# Patient Record
Sex: Female | Born: 1970 | Race: White | Hispanic: No | Marital: Married | Smoking: Never smoker
Health system: Southern US, Community
[De-identification: ages and names within clinical notes are randomized; demographics above are authoritative.]

## PROBLEM LIST (undated history)

## (undated) DIAGNOSIS — G43909 Migraine, unspecified, not intractable, without status migrainosus: Secondary | ICD-10-CM

## (undated) DIAGNOSIS — N200 Calculus of kidney: Secondary | ICD-10-CM

## (undated) DIAGNOSIS — E785 Hyperlipidemia, unspecified: Secondary | ICD-10-CM

## (undated) DIAGNOSIS — K219 Gastro-esophageal reflux disease without esophagitis: Secondary | ICD-10-CM

## (undated) HISTORY — PX: ABDOMINAL HYSTERECTOMY: SHX81

## (undated) HISTORY — DX: Migraine, unspecified, not intractable, without status migrainosus: G43.909

## (undated) HISTORY — DX: Gastro-esophageal reflux disease without esophagitis: K21.9

## (undated) HISTORY — DX: Hyperlipidemia, unspecified: E78.5

## (undated) HISTORY — PX: CHOLECYSTECTOMY: SHX55

## (undated) HISTORY — PX: TONSILLECTOMY: SUR1361

## (undated) HISTORY — PX: APPENDECTOMY: SHX54

---

## 2001-05-28 DIAGNOSIS — N200 Calculus of kidney: Secondary | ICD-10-CM

## 2001-05-28 HISTORY — DX: Calculus of kidney: N20.0

## 2009-03-20 ENCOUNTER — Ambulatory Visit: Payer: Self-pay | Admitting: Diagnostic Radiology

## 2009-03-20 ENCOUNTER — Emergency Department (HOSPITAL_BASED_OUTPATIENT_CLINIC_OR_DEPARTMENT_OTHER): Admission: EM | Admit: 2009-03-20 | Discharge: 2009-03-20 | Payer: Self-pay | Admitting: Emergency Medicine

## 2010-06-29 ENCOUNTER — Observation Stay (HOSPITAL_COMMUNITY)
Admission: EM | Admit: 2010-06-29 | Discharge: 2010-06-30 | Disposition: A | Discharge status: Home / Self Care | Payer: 59 | Attending: Emergency Medicine | Admitting: Emergency Medicine

## 2010-06-29 ENCOUNTER — Emergency Department (INDEPENDENT_AMBULATORY_CARE_PROVIDER_SITE_OTHER): Payer: 59

## 2010-06-29 ENCOUNTER — Emergency Department (HOSPITAL_BASED_OUTPATIENT_CLINIC_OR_DEPARTMENT_OTHER)
Admission: EM | Admit: 2010-06-29 | Discharge: 2010-06-29 | Disposition: A | Discharge status: Nursing Home (Includes ICF) | Payer: 59 | Source: Home / Self Care | Attending: Emergency Medicine | Admitting: Emergency Medicine

## 2010-06-29 DIAGNOSIS — F3289 Other specified depressive episodes: Secondary | ICD-10-CM | POA: Insufficient documentation

## 2010-06-29 DIAGNOSIS — R209 Unspecified disturbances of skin sensation: Secondary | ICD-10-CM | POA: Insufficient documentation

## 2010-06-29 DIAGNOSIS — R51 Headache: Secondary | ICD-10-CM | POA: Insufficient documentation

## 2010-06-29 DIAGNOSIS — F329 Major depressive disorder, single episode, unspecified: Secondary | ICD-10-CM | POA: Insufficient documentation

## 2010-06-29 LAB — BASIC METABOLIC PANEL
BUN: 12 mg/dL (ref 6–23)
CO2: 26 mEq/L (ref 19–32)
Calcium: 9.7 mg/dL (ref 8.4–10.5)
GFR calc Af Amer: >60 mL/min (ref >60)
GFR calc non Af Amer: >60 mL/min (ref >60)
Glucose, Bld: 93 mg/dL (ref 70–99)

## 2010-06-29 LAB — DIFFERENTIAL
Eosinophils Absolute: 0.3 10*3/uL (ref 0.0–0.7)
Lymphocytes Relative: 33 % (ref 12–46)
Lymphs Abs: 3.8 10*3/uL (ref 0.7–4.0)
Monocytes Absolute: 1 10*3/uL (ref 0.1–1.0)
Monocytes Relative: 9 % (ref 3–12)
Neutro Abs: 6.5 10*3/uL (ref 1.7–7.7)
Neutrophils Relative %: 56 % (ref 43–77)

## 2010-06-29 LAB — CBC
HCT: 38.7 % (ref 36.0–46.0)
MCHC: 34.4 g/dL (ref 30.0–36.0)
MCV: 83.9 fL (ref 78.0–100.0)
Platelets: 426 10*3/uL — ABNORMAL HIGH (ref 150–400)
RBC: 4.61 MIL/uL (ref 3.87–5.11)
WBC: 11.6 10*3/uL — ABNORMAL HIGH (ref 4.0–10.5)

## 2010-06-30 ENCOUNTER — Observation Stay (HOSPITAL_COMMUNITY): Payer: 59

## 2010-06-30 DIAGNOSIS — G459 Transient cerebral ischemic attack, unspecified: Secondary | ICD-10-CM

## 2010-06-30 LAB — LIPID PANEL
Cholesterol: 184 mg/dL (ref 0–200)
LDL Cholesterol: 127 mg/dL — ABNORMAL HIGH (ref 0–99)
Total CHOL/HDL Ratio: 5.3 RATIO
Triglycerides: 109 mg/dL (ref <150)
VLDL: 22 mg/dL (ref 0–40)

## 2010-06-30 LAB — URINALYSIS, ROUTINE W REFLEX MICROSCOPIC
Bilirubin Urine: NEGATIVE
Hgb urine dipstick: NEGATIVE
Ketones, ur: NEGATIVE mg/dL
Nitrite: NEGATIVE
Protein, ur: NEGATIVE mg/dL
Urobilinogen, UA: 0.2 mg/dL (ref 0.0–1.0)

## 2010-06-30 LAB — COMPREHENSIVE METABOLIC PANEL
CO2: 23 mEq/L (ref 19–32)
Chloride: 105 mEq/L (ref 96–112)
GFR calc Af Amer: >60 mL/min (ref >60)
Potassium: 3.4 mEq/L — ABNORMAL LOW (ref 3.5–5.1)
Total Bilirubin: 0.3 mg/dL (ref 0.3–1.2)
Total Protein: 6.9 g/dL (ref 6.0–8.3)

## 2010-06-30 LAB — CK TOTAL AND CKMB (NOT AT ARMC)
CK, MB: 0.7 ng/mL (ref 0.3–4.0)
Total CK: 55 U/L (ref 7–177)
Total CK: 64 U/L (ref 7–177)

## 2010-06-30 LAB — PROTIME-INR: Prothrombin Time: 13.8 seconds (ref 11.6–15.2)

## 2010-06-30 LAB — HEMOGLOBIN A1C: Mean Plasma Glucose: 111 mg/dL (ref <117)

## 2010-06-30 LAB — TROPONIN I: Troponin I: 0.02 ng/mL (ref 0.00–0.06)

## 2013-02-21 ENCOUNTER — Emergency Department (HOSPITAL_BASED_OUTPATIENT_CLINIC_OR_DEPARTMENT_OTHER): Payer: 59

## 2013-02-21 ENCOUNTER — Emergency Department (HOSPITAL_BASED_OUTPATIENT_CLINIC_OR_DEPARTMENT_OTHER)
Admission: EM | Admit: 2013-02-21 | Discharge: 2013-02-21 | Disposition: A | Discharge status: Home / Self Care | Payer: 59 | Attending: Emergency Medicine | Admitting: Emergency Medicine

## 2013-02-21 ENCOUNTER — Encounter (HOSPITAL_BASED_OUTPATIENT_CLINIC_OR_DEPARTMENT_OTHER): Payer: Self-pay | Admitting: Emergency Medicine

## 2013-02-21 DIAGNOSIS — R0602 Shortness of breath: Secondary | ICD-10-CM | POA: Insufficient documentation

## 2013-02-21 DIAGNOSIS — IMO0001 Reserved for inherently not codable concepts without codable children: Secondary | ICD-10-CM | POA: Insufficient documentation

## 2013-02-21 DIAGNOSIS — J90 Pleural effusion, not elsewhere classified: Secondary | ICD-10-CM | POA: Insufficient documentation

## 2013-02-21 DIAGNOSIS — R059 Cough, unspecified: Secondary | ICD-10-CM | POA: Insufficient documentation

## 2013-02-21 DIAGNOSIS — R091 Pleurisy: Secondary | ICD-10-CM

## 2013-02-21 DIAGNOSIS — R05 Cough: Secondary | ICD-10-CM | POA: Insufficient documentation

## 2013-02-21 LAB — CBC WITH DIFFERENTIAL/PLATELET
Basophils Absolute: 0 10*3/uL (ref 0.0–0.1)
Basophils Relative: 0 % (ref 0–1)
Eosinophils Absolute: 0.3 10*3/uL (ref 0.0–0.7)
Hemoglobin: 13.3 g/dL (ref 12.0–15.0)
Lymphocytes Relative: 30 % (ref 12–46)
MCH: 28.7 pg (ref 26.0–34.0)
MCHC: 32.9 g/dL (ref 30.0–36.0)
MCV: 87.3 fL (ref 78.0–100.0)
Monocytes Relative: 8 % (ref 3–12)
Neutrophils Relative %: 60 % (ref 43–77)

## 2013-02-21 LAB — BASIC METABOLIC PANEL
BUN: 12 mg/dL (ref 6–23)
Chloride: 102 mEq/L (ref 96–112)
Creatinine, Ser: 0.9 mg/dL (ref 0.50–1.10)
GFR calc Af Amer: >90 mL/min (ref >90)
GFR calc non Af Amer: 78 mL/min — ABNORMAL LOW (ref >90)
Potassium: 4 mEq/L (ref 3.5–5.1)

## 2013-02-21 MED ORDER — IOHEXOL 350 MG/ML SOLN
80.0000 mL | Freq: Once | INTRAVENOUS | Status: AC | PRN
  Administered 2013-02-21: 80 mL via INTRAVENOUS

## 2013-02-21 MED ORDER — IBUPROFEN 600 MG PO TABS: 600.0000 mg | ORAL_TABLET | Freq: Four times a day (QID) | ORAL | Status: DC | PRN

## 2013-02-21 NOTE — ED Notes (Signed)
Pt reports left chest pain  That radiates to back and Left shoulder onset about 1 pm today denies hx cardiac pain increases with movement left arm

## 2013-02-22 NOTE — ED Provider Notes (Signed)
CSN: [id number]     Arrival date & time 02/21/13  1901 History   First MD Initiated Contact with Patient 02/21/13 1929     Chief Complaint  Patient presents with  . Chest Pain    shoulder pain with SOB   (Consider location/radiation/quality/duration/timing/severity/associated sxs/prior Treatment) HPI Comments: Patient presents with left lateral anterior chest pain that radiates to her left back and left shoulder that began at approximately 1 PM today. She denies recent injury. Pain is worse with deep breathing, coughing, and movement of her left arm. It is not made worse with palpation. She's taking over-the-counter medications without relief. She feels like she is having trouble catching her breath. She denies fever or productive cough. No nausea or vomiting, abdominal pain. No urinary symptoms. Patient does not have a history of blood clots, estrogen use, smoking, recent surgery, or lower extremity swelling. Patient was on a recent airplane ride from [facility] to [place]. No other treatments prior to arrival. No risk factors for coronary artery disease. Onset of symptoms acute. Nothing makes symptoms better.  Patient is a 42 y.o. female presenting with chest pain. The history is provided by the patient.  Chest Pain Associated symptoms: cough and shortness of breath   Associated symptoms: no abdominal pain, no back pain, no diaphoresis, no fever, no nausea, no palpitations and not vomiting     History reviewed. No pertinent past medical history. Past Surgical History  Procedure Laterality Date  . Cholecystectomy    . Appendectomy    . Cesarean section    . Tonsillectomy    . Abdominal hysterectomy     History reviewed. No pertinent family history. History  Substance Use Topics  . Smoking status: Never Smoker   . Smokeless tobacco: Not on file  . Alcohol Use: Not on file   OB History   Grav Para Term Preterm Abortions TAB SAB Ect Mult Living                 Review of Systems   Constitutional: Negative for fever and diaphoresis.  HENT: Negative for neck pain.   Eyes: Negative for redness.  Respiratory: Positive for cough and shortness of breath.   Cardiovascular: Positive for chest pain. Negative for palpitations and leg swelling.  Gastrointestinal: Negative for nausea, vomiting and abdominal pain.  Genitourinary: Negative for dysuria.  Musculoskeletal: Positive for myalgias. Negative for back pain.  Skin: Negative for rash.  Neurological: Negative for syncope and light-headedness.    Allergies  Review of patient's allergies indicates no known allergies.  Home Medications   Current Outpatient Rx  Name  Route  Sig  Dispense  Refill  . ibuprofen (ADVIL,MOTRIN) 600 MG tablet   Oral   Take 1 tablet (600 mg total) by mouth every 6 (six) hours as needed for pain.   20 tablet   0    BP 113/77  Pulse 72  Temp(Src) 97.6 F (36.4 C) (Oral)  Resp 16  Ht 5\' 3"  (1.6 m)  Wt 195 lb (88.451 kg)  BMI 34.55 kg/m2  SpO2 100% Physical Exam  Nursing note and vitals reviewed. Constitutional: She appears well-developed and well-nourished.  HENT:  Head: Normocephalic and atraumatic.  Mouth/Throat: Mucous membranes are normal. Mucous membranes are not dry.  Eyes: Conjunctivae are normal.  Neck: Trachea normal and normal range of motion. Neck supple. Normal carotid pulses and no JVD present. No muscular tenderness present. Carotid bruit is not present. No tracheal deviation present.  Cardiovascular: Normal rate, regular rhythm,  S1 normal, S2 normal, normal heart sounds and intact distal pulses.  Exam reveals no decreased pulses.   No murmur heard. Pulmonary/Chest: Effort normal. No respiratory distress. She has no wheezes. She exhibits no tenderness.  Chest pain is not reproducible on my exam.  Abdominal: Soft. Normal aorta and bowel sounds are normal. There is no tenderness. There is no rebound and no guarding.  Musculoskeletal: Normal range of motion.   Neurological: She is alert.  Skin: Skin is warm and dry. She is not diaphoretic. No cyanosis. No pallor.  Psychiatric: She has a normal mood and affect.    ED Course  Procedures (including critical care time) Labs Review Labs Reviewed  D-DIMER, QUANTITATIVE - Abnormal; Notable for the following:    D-Dimer, Quant 0.82 (*)    All other components within normal limits  CBC WITH DIFFERENTIAL - Abnormal; Notable for the following:    WBC 13.6 (*)    Neutro Abs 8.2 (*)    Lymphs Abs 4.1 (*)    Monocytes Absolute 1.1 (*)    All other components within normal limits  BASIC METABOLIC PANEL - Abnormal; Notable for the following:    Glucose, Bld 102 (*)    GFR calc non Af Amer 78 (*)    All other components within normal limits   Imaging Review Dg Chest 2 View  02/21/2013   CLINICAL DATA:  Pleuritic left chest pain, unable to catch her breast  EXAM: CHEST  2 VIEW  COMPARISON:  None  FINDINGS: Normal heart size, mediastinal contours, and pulmonary vascularity.  Lungs clear.  Bones unremarkable.  No pneumothorax.  IMPRESSION: No acute abnormalities.   Electronically Signed   By: Ulyses Southward M.D.   On: 02/21/2013 20:57   Ct Angio Chest Pe W/cm &/or Wo Cm  02/21/2013   *RADIOLOGY REPORT*  Clinical Data: 42 year old female with chest pain and elevated D- dimer.  CT ANGIOGRAPHY CHEST  Technique:  Multidetector CT imaging of the chest using the standard protocol during bolus administration of intravenous contrast. Multiplanar reconstructed images including MIPs were obtained and reviewed to evaluate the vascular anatomy.  Contrast: 80mL OMNIPAQUE IOHEXOL 350 MG/ML SOLN  Comparison: 02/21/2013 chest radiograph  Findings: This is a technically satisfactory study.  There is no evidence of pulmonary emboli. The heart and great vessels are unremarkable.  There is no evidence of thoracic aortic aneurysm/dissection. No pleural or pericardial effusions are present. There are no enlarged lymph nodes.  There is  no evidence of airspace disease, consolidation, suspicious nodule, mass or endobronchial/endotracheal lesion.  No acute or suspicious bony abnormalities are identified. The visualized upper abdomen is within normal limits.  IMPRESSION: Unremarkable exam - no evidence of pulmonary emboli or thoracic aortic aneurysm/dissection.   Original Report Authenticated By: Harmon Pier, M.D.   Patient seen and examined. Work-up initiated. Discussed utility of d-dimer and need to followup with chest CT if positive. Patient agrees to proceed.   Vital signs reviewed and are as follows: Filed Vitals:   02/21/13 2242  BP: 113/77  Pulse:   Temp: 97.6 F (36.4 C)  Resp: 16    Date: 02/22/2013  Rate: 77  Rhythm: normal sinus rhythm  QRS Axis: normal  Intervals: normal  ST/T Wave abnormalities: normal  Conduction Disutrbances:none  Narrative Interpretation:   Old EKG Reviewed: unchanged from 06/29/10  D-dimer elevated. Patient informed. Will proceed with CT angiography of the chest.  CT reviewed and interpreted by myself. No acute findings of PE or other abnormality per  radiologist. Patient informed. Will treat with nonsteroidal anti-inflammatory medications. Patient to return with fever, worsening trouble breathing or shortness of breath, change in chest pain.   MDM   1. Pleurisy    Patient with chest pain, suspect pleurisy or musculoskeletal pain.  Doubt cardiac origin of pain given: atypical CP, pleuritic nature of pain reproducable with movement and deep inspiration, normal EKG, no exertional component, lack of accompanying sx including palps, sweating. Do not feel any additional testing is warranted given lack of risk factors for CAD. Patient ruled out for pulmonary embolism. No life-threatening or serious etiologies requiring hospitalization suggested by workup or suspected at this time. Patient stable for discharge home with conservative management.     Renne Crigler, PA-C 02/22/13 [phone]

## 2013-02-26 NOTE — ED Provider Notes (Signed)
Medical screening examination/treatment/procedure(s) were performed by non-physician practitioner and as supervising physician I was immediately available for consultation/collaboration.   Valerian Jewel, MD 02/26/13 1514 

## 2014-02-18 DIAGNOSIS — R5383 Other fatigue: Secondary | ICD-10-CM | POA: Insufficient documentation

## 2014-02-18 DIAGNOSIS — Z0000 Encounter for general adult medical examination without abnormal findings: Secondary | ICD-10-CM | POA: Insufficient documentation

## 2015-06-19 ENCOUNTER — Encounter (HOSPITAL_BASED_OUTPATIENT_CLINIC_OR_DEPARTMENT_OTHER): Payer: Self-pay | Admitting: *Deleted

## 2015-06-19 ENCOUNTER — Emergency Department (HOSPITAL_BASED_OUTPATIENT_CLINIC_OR_DEPARTMENT_OTHER)
Admission: EM | Admit: 2015-06-19 | Discharge: 2015-06-19 | Disposition: A | Discharge status: Home / Self Care | Payer: 59 | Attending: Emergency Medicine | Admitting: Emergency Medicine

## 2015-06-19 DIAGNOSIS — G43109 Migraine with aura, not intractable, without status migrainosus: Secondary | ICD-10-CM | POA: Insufficient documentation

## 2015-06-19 DIAGNOSIS — J0110 Acute frontal sinusitis, unspecified: Secondary | ICD-10-CM | POA: Diagnosis not present

## 2015-06-19 DIAGNOSIS — R200 Anesthesia of skin: Secondary | ICD-10-CM | POA: Insufficient documentation

## 2015-06-19 DIAGNOSIS — R51 Headache: Secondary | ICD-10-CM | POA: Diagnosis present

## 2015-06-19 MED ORDER — KETOROLAC TROMETHAMINE 30 MG/ML IJ SOLN
30.0000 mg | Freq: Once | INTRAMUSCULAR | Status: AC
  Administered 2015-06-19: 30 mg via INTRAVENOUS
  Filled 2015-06-19: qty 1

## 2015-06-19 MED ORDER — MOMETASONE FUROATE 50 MCG/ACT NA SUSP: 2.0000 | Freq: Every day | NASAL | Status: DC

## 2015-06-19 MED ORDER — LORATADINE 10 MG PO TABS
10.0000 mg | ORAL_TABLET | Freq: Once | ORAL | Status: AC
  Administered 2015-06-19: 10 mg via ORAL
  Filled 2015-06-19: qty 1

## 2015-06-19 MED ORDER — DIPHENHYDRAMINE HCL 50 MG/ML IJ SOLN
25.0000 mg | Freq: Once | INTRAMUSCULAR | Status: AC
  Administered 2015-06-19: 25 mg via INTRAVENOUS
  Filled 2015-06-19: qty 1

## 2015-06-19 MED ORDER — METOCLOPRAMIDE HCL 10 MG PO TABS: 10.0000 mg | ORAL_TABLET | Freq: Four times a day (QID) | ORAL | Status: DC | PRN

## 2015-06-19 MED ORDER — LORATADINE 10 MG PO TABS: 10.0000 mg | ORAL_TABLET | Freq: Every day | ORAL | Status: DC

## 2015-06-19 MED ORDER — OXYMETAZOLINE HCL 0.05 % NA SOLN: 2.0000 | Freq: Two times a day (BID) | NASAL | Status: DC

## 2015-06-19 MED ORDER — METHYLPREDNISOLONE SODIUM SUCC 125 MG IJ SOLR
125.0000 mg | Freq: Once | INTRAMUSCULAR | Status: AC
  Administered 2015-06-19: 125 mg via INTRAVENOUS
  Filled 2015-06-19: qty 2

## 2015-06-19 MED ORDER — METOCLOPRAMIDE HCL 5 MG/ML IJ SOLN
10.0000 mg | Freq: Once | INTRAMUSCULAR | Status: AC
  Administered 2015-06-19: 10 mg via INTRAVENOUS
  Filled 2015-06-19: qty 2

## 2015-06-19 MED ORDER — SODIUM CHLORIDE 0.9 % IV BOLUS (SEPSIS)
1000.0000 mL | Freq: Once | INTRAVENOUS | Status: AC
  Administered 2015-06-19: 1000 mL via INTRAVENOUS

## 2015-06-19 NOTE — ED Provider Notes (Signed)
CSN: [id number]     Arrival date & time 06/19/15  1832 History  By signing my name below, I, Budd Palmer, attest that this documentation has been prepared under the direction and in the presence of Loren Racer, MD. Electronically Signed: Budd Palmer, ED Scribe. 06/19/2015. 8:23 PM.     Chief Complaint  Patient presents with  . Headache   The history is provided by the patient. No language interpreter was used.   HPI Comments: Katrina Elliott is a 45 y.o. female who presents to the Emergency Department complaining of a constant, aching frontal HA behind the right eye and radiating into the temple onset 4 hours ago. Pt states she had numbness in the right arm, difficulty speaking, and "squiggly lines" in the right eye at onset, all of which have since resolved. She currently endorses nausea and photophobia, as well as a persistent cough. She has taken excedrin for this 3 hours ago without relief. She notes she has been stressed recently and has not eaten much today. However, she states she has been drinking plenty of fluids. She notes a PMHx of the same for which she was seen at [facility] with a suspected stroke, which later was diagnosed as a complex migraine. She notes she was sick with a cold 1-2 weeks ago, which has been improving. Pt denies sore throat, rhinorrhea, and fever.   History reviewed. No pertinent past medical history. Past Surgical History  Procedure Laterality Date  . Cholecystectomy    . Appendectomy    . Cesarean section    . Tonsillectomy    . Abdominal hysterectomy     No family history on file. Social History  Substance Use Topics  . Smoking status: Never Smoker   . Smokeless tobacco: Never Used  . Alcohol Use: No   OB History    No data available     Review of Systems  Constitutional: Positive for chills. Negative for fever.  HENT: Positive for postnasal drip and sinus pressure. Negative for rhinorrhea and sore throat.   Eyes: Positive for photophobia and  visual disturbance.  Respiratory: Positive for cough. Negative for shortness of breath.   Gastrointestinal: Positive for nausea. Negative for vomiting and abdominal pain.  Genitourinary: Negative for dysuria, frequency, flank pain, enuresis and difficulty urinating.  Musculoskeletal: Negative for myalgias, back pain, neck pain and neck stiffness.  Skin: Negative for rash and wound.  Neurological: Positive for numbness and headaches. Negative for dizziness, weakness and light-headedness.  All other systems reviewed and are negative.   Allergies  Review of patient's allergies indicates no known allergies.  Home Medications   Prior to Admission medications   Medication Sig Start Date End Date Taking? Authorizing Provider  ibuprofen (ADVIL,MOTRIN) 600 MG tablet Take 1 tablet (600 mg total) by mouth every 6 (six) hours as needed for pain. 02/21/13   Renne Crigler, PA-C  loratadine (CLARITIN) 10 MG tablet Take 1 tablet (10 mg total) by mouth daily. 06/19/15   Loren Racer, MD  metoCLOPramide (REGLAN) 10 MG tablet Take 1 tablet (10 mg total) by mouth every 6 (six) hours as needed for nausea or vomiting (headache). 06/19/15   Loren Racer, MD  mometasone (NASONEX) 50 MCG/ACT nasal spray Place 2 sprays into the nose daily. 06/19/15   Loren Racer, MD  oxymetazoline (AFRIN NASAL SPRAY) 0.05 % nasal spray Place 2 sprays into both nostrils 2 (two) times daily. Use no more than 3 days 06/19/15   Loren Racer, MD   BP 113/65 mmHg  Pulse 81  Temp(Src) 98.3 F (36.8 C) (Oral)  Resp 18  Ht  (1.6 m)  Wt 200 lb (90.719 kg)  BMI 35.44 kg/m2  SpO2 99% Physical Exam  Constitutional: She is oriented to person, place, and time. She appears well-developed and well-nourished. No distress.  HENT:  Head: Normocephalic and atraumatic.  Mouth/Throat: Oropharynx is clear and moist. No oropharyngeal exudate.  Patient has tenderness to percussion over her left frontal sinus. She does have nasal  mucosal swelling.  Eyes: EOM are normal. Pupils are equal, round, and reactive to light.  Neck: Normal range of motion. Neck supple.  No meningismus. No cervical lymphadenopathy.  Cardiovascular: Normal rate and regular rhythm.  Exam reveals no gallop and no friction rub.   No murmur heard. Pulmonary/Chest: Effort normal and breath sounds normal. No respiratory distress. She has no wheezes. She has no rales. She exhibits no tenderness.  Abdominal: Soft. Bowel sounds are normal. She exhibits no distension and no mass. There is no tenderness. There is no rebound and no guarding.  Musculoskeletal: Normal range of motion. She exhibits no edema or tenderness.  No midline thoracic or lumbar tenderness. No CVA tenderness.  Neurological: She is alert and oriented to person, place, and time.  Patient is alert and oriented x3 with clear, goal oriented speech. Patient has 5/5 motor in all extremities. Sensation is intact to light touch. Bilateral finger-to-nose is normal with no signs of dysmetria. Patient has a normal gait and walks without assistance.  Skin: Skin is warm and dry. No rash noted. No erythema.  Psychiatric: She has a normal mood and affect. Her behavior is normal.  Nursing note and vitals reviewed.   ED Course  Procedures  DIAGNOSTIC STUDIES: Oxygen Saturation is 99% on RA, normal by my interpretation.    COORDINATION OF CARE: 8:19 PM - Discussed probable sinusitis which triggered the migraine. Discussed plans to order medication for migraine and sinusitis. Pt advised of plan for treatment and pt agrees.  Labs Review Labs Reviewed - No data to display  Imaging Review No results found. I have personally reviewed and evaluated these images and lab results as part of my medical decision-making.   EKG Interpretation None      MDM   Final diagnoses:  Migraine with aura and without status migrainosus, not intractable  Acute frontal sinusitis, recurrence not specified    I  personally performed the services described in this documentation, which was scribed in my presence. The recorded information has been reviewed and is accurate.   Patient has history of, migraines. She presents with right hand numbness in association with photophobia, nausea and throbbing right-sided headache. Her numbness has resolved. She has a normal neurologic exam. Questionable left frontal sinusitis given postnasal drip, nasal congestion and sinus tenderness. We'll treat and reevaluate. Do not believe that imaging is necessary at this point.  Headache is resolved. Patient maintains a normal neurologic exam. Return precautions given.  Loren Racer, MD 06/19/15 2153

## 2015-06-19 NOTE — Discharge Instructions (Signed)
Migraine Headache A migraine headache is very bad, throbbing pain on one or both sides of your head. Talk to your doctor about what things may bring on (trigger) your migraine headaches. HOME CARE  Only take medicines as told by your doctor.  Lie down in a dark, quiet room when you have a migraine.  Keep a journal to find out if certain things bring on migraine headaches. For example, write down:  What you eat and drink.  How much sleep you get.  Any change to your diet or medicines.  Lessen how much alcohol you drink.  Quit smoking if you smoke.  Get enough sleep.  Lessen any stress in your life.  Keep lights dim if bright lights bother you or make your migraines worse. GET HELP RIGHT AWAY IF:   Your migraine becomes really bad.  You have a fever.  You have a stiff neck.  You have trouble seeing.  Your muscles are weak, or you lose muscle control.  You lose your balance or have trouble walking.  You feel like you will pass out (faint), or you pass out.  You have really bad symptoms that are different than your first symptoms. MAKE SURE YOU:   Understand these instructions.  Will watch your condition.  Will get help right away if you are not doing well or get worse.   This information is not intended to replace advice given to you by your health care provider. Make sure you discuss any questions you have with your health care provider.   Document Released: 02/21/2008 Document Revised: 08/06/2011 Document Reviewed: 01/19/2013 Elsevier Interactive Patient Education 2016 [employer]  Sinusitis, Adult Sinusitis is redness, soreness, and inflammation of the paranasal sinuses. Paranasal sinuses are air pockets within the bones of your face. They are located beneath your eyes, in the middle of your forehead, and above your eyes. In healthy paranasal sinuses, mucus is able to drain out, and air is able to circulate through them by way of your nose. However, when  your paranasal sinuses are inflamed, mucus and air can become trapped. This can allow bacteria and other germs to grow and cause infection. Sinusitis can develop quickly and last only a short time (acute) or continue over a long period (chronic). Sinusitis that lasts for more than 12 weeks is considered chronic. CAUSES Causes of sinusitis include:  Allergies.  Structural abnormalities, such as displacement of the cartilage that separates your nostrils (deviated septum), which can decrease the air flow through your nose and sinuses and affect sinus drainage.  Functional abnormalities, such as when the small hairs (cilia) that line your sinuses and help remove mucus do not work properly or are not present. SIGNS AND SYMPTOMS Symptoms of acute and chronic sinusitis are the same. The primary symptoms are pain and pressure around the affected sinuses. Other symptoms include:  Upper toothache.  Earache.  Headache.  Bad breath.  Decreased sense of smell and taste.  A cough, which worsens when you are lying flat.  Fatigue.  Fever.  Thick drainage from your nose, which often is green and may contain pus (purulent).  Swelling and warmth over the affected sinuses. DIAGNOSIS Your health care provider will perform a physical exam. During your exam, your health care provider may perform any of the following to help determine if you have acute sinusitis or chronic sinusitis:  Look in your nose for signs of abnormal growths in your nostrils (nasal polyps).  Tap over the affected sinus to check  for signs of infection.  View the inside of your sinuses using an imaging device that has a light attached (endoscope). If your health care provider suspects that you have chronic sinusitis, one or more of the following tests may be recommended:  Allergy tests.  Nasal culture. A sample of mucus is taken from your nose, sent to a lab, and screened for bacteria.  Nasal cytology. A sample of mucus is  taken from your nose and examined by your health care provider to determine if your sinusitis is related to an allergy. TREATMENT Most cases of acute sinusitis are related to a viral infection and will resolve on their own within 10 days. Sometimes, medicines are prescribed to help relieve symptoms of both acute and chronic sinusitis. These may include pain medicines, decongestants, nasal steroid sprays, or saline sprays. However, for sinusitis related to a bacterial infection, your health care provider will prescribe antibiotic medicines. These are medicines that will help kill the bacteria causing the infection. Rarely, sinusitis is caused by a fungal infection. In these cases, your health care provider will prescribe antifungal medicine. For some cases of chronic sinusitis, surgery is needed. Generally, these are cases in which sinusitis recurs more than 3 times per year, despite other treatments. HOME CARE INSTRUCTIONS  Drink plenty of water. Water helps thin the mucus so your sinuses can drain more easily.  Use a humidifier.  Inhale steam 3-4 times a day (for example, sit in the bathroom with the shower running).  Apply a warm, moist washcloth to your face 3-4 times a day, or as directed by your health care provider.  Use saline nasal sprays to help moisten and clean your sinuses.  Take medicines only as directed by your health care provider.  If you were prescribed either an antibiotic or antifungal medicine, finish it all even if you start to feel better. SEEK IMMEDIATE MEDICAL CARE IF:  You have increasing pain or severe headaches.  You have nausea, vomiting, or drowsiness.  You have swelling around your face.  You have vision problems.  You have a stiff neck.  You have difficulty breathing.   This information is not intended to replace advice given to you by your health care provider. Make sure you discuss any questions you have with your health care provider.   Document  Released: 05/14/2005 Document Revised: 06/04/2014 Document Reviewed: 05/29/2011 Elsevier Interactive Patient Education [employer]

## 2015-06-19 NOTE — ED Notes (Signed)
C/o HA, onset 1600, also nausea, aura, light sensitivity, and R hand numbness, rates 8/10. (denies: vomiting, dizziness, fever, rash or other sx)

## 2015-06-19 NOTE — ED Notes (Signed)
Pt reports headache onset 430pm; reports she had right hand numbness which has resolved. +nasuea. Has had migraines in the past

## 2015-06-19 NOTE — ED Notes (Signed)
Dr. Georgiann Mccoy at [facility]

## 2016-07-10 DIAGNOSIS — Z23 Encounter for immunization: Secondary | ICD-10-CM | POA: Insufficient documentation

## 2016-10-14 ENCOUNTER — Encounter (HOSPITAL_BASED_OUTPATIENT_CLINIC_OR_DEPARTMENT_OTHER): Payer: Self-pay | Admitting: *Deleted

## 2016-10-14 ENCOUNTER — Emergency Department (HOSPITAL_BASED_OUTPATIENT_CLINIC_OR_DEPARTMENT_OTHER): Payer: 59

## 2016-10-14 ENCOUNTER — Emergency Department (HOSPITAL_BASED_OUTPATIENT_CLINIC_OR_DEPARTMENT_OTHER)
Admission: EM | Admit: 2016-10-14 | Discharge: 2016-10-14 | Disposition: A | Discharge status: Home / Self Care | Payer: 59 | Attending: Emergency Medicine | Admitting: Emergency Medicine

## 2016-10-14 DIAGNOSIS — R109 Unspecified abdominal pain: Secondary | ICD-10-CM

## 2016-10-14 DIAGNOSIS — R1084 Generalized abdominal pain: Secondary | ICD-10-CM | POA: Insufficient documentation

## 2016-10-14 HISTORY — DX: Calculus of kidney: N20.0

## 2016-10-14 HISTORY — DX: Migraine, unspecified, not intractable, without status migrainosus: G43.909

## 2016-10-14 LAB — URINALYSIS, ROUTINE W REFLEX MICROSCOPIC
BILIRUBIN URINE: NEGATIVE
Glucose, UA: NEGATIVE mg/dL
Hgb urine dipstick: NEGATIVE
Ketones, ur: NEGATIVE mg/dL
LEUKOCYTES UA: NEGATIVE
NITRITE: NEGATIVE
PH: 5.5 (ref 5.0–8.0)
PROTEIN: NEGATIVE mg/dL
Specific Gravity, Urine: 1.021 (ref 1.005–1.030)

## 2016-10-14 MED ORDER — METHOCARBAMOL 500 MG PO TABS: 500.0000 mg | ORAL_TABLET | Freq: Two times a day (BID) | ORAL | 0 refills | Status: DC

## 2016-10-14 MED ORDER — KETOROLAC TROMETHAMINE 30 MG/ML IJ SOLN
30.0000 mg | Freq: Once | INTRAMUSCULAR | Status: AC
  Administered 2016-10-14: 30 mg via INTRAVENOUS
  Filled 2016-10-14: qty 1

## 2016-10-14 MED ORDER — IBUPROFEN 800 MG PO TABS: 800.0000 mg | ORAL_TABLET | Freq: Three times a day (TID) | ORAL | 0 refills | Status: AC | PRN

## 2016-10-14 NOTE — ED Triage Notes (Addendum)
Patient states that she developed left flank pain three days ago.  States the pain has progressively gotten worse.  History of kidney stones 15 years ago. Pain is associated with burning with urination.

## 2016-10-14 NOTE — ED Notes (Signed)
Patient transported to CT 

## 2016-10-14 NOTE — ED Provider Notes (Signed)
Emergency Department Provider Note   I have reviewed the triage vital signs and the nursing notes.   HISTORY  Chief Complaint Flank Pain (left)   HPI Katrina Elliott is a 46 y.o. female with past medical history of kidney stones presents to the emergency department for evaluation of left flank pain over the last 3 days. She has some associated dysuria but no hematuria. No fevers or chills. Her symptoms have been waxing and waning of the past several days but become more persistent today which prompted the emergency department visit. Chest pain or difficulty breathing. Pain does not radiate. No vaginal bleeding or discharge. No modifying factors.   Past Medical History:  Diagnosis Date  . Kidney stone 2003  . Migraine     There are no active problems to display for this patient.   Past Surgical History:  Procedure Laterality Date  . ABDOMINAL HYSTERECTOMY    . APPENDECTOMY    . CESAREAN SECTION    . CHOLECYSTECTOMY    . TONSILLECTOMY      Current Outpatient Rx  . Order #: [id number] Class: Print  . Order #: [id number] Class: Historical Med  . Order #: [id number] Class: Historical Med  . Order #: [id number] Class: Print  . Order #: [id number] Class: Print  . Order #: [id number] Class: Print  . Order #: [id number] Class: Print    Allergies Patient has no known allergies.  No family history on file.  Social History Social History  Substance Use Topics  . Smoking status: Never Smoker  . Smokeless tobacco: Never Used  . Alcohol use No    Review of Systems  Constitutional: No fever/chills Eyes: No visual changes. ENT: No sore throat. Cardiovascular: Denies chest pain. Respiratory: Denies shortness of breath. Gastrointestinal: No abdominal pain.  No nausea, no vomiting.  No diarrhea.  No constipation. Genitourinary: Positive for dysuria and left flank pain.  Musculoskeletal: Negative for back pain. Skin: Negative for rash. Neurological: Negative for headaches, focal weakness  or numbness.  10-point ROS otherwise negative.  ____________________________________________   PHYSICAL EXAM:  VITAL SIGNS: ED Triage Vitals  Enc Vitals Group     BP 10/14/16 1032 124/79     Pulse Rate 10/14/16 1032 84     Resp 10/14/16 1032 18     Temp 10/14/16 1032 98.3 F (36.8 C)     Temp Source 10/14/16 1032 Oral     SpO2 10/14/16 1032 100 %     Weight 10/14/16 1032 190 lb (86.2 kg)     Height 10/14/16 1032 5\' 3"  (1.6 m)     Pain Score 10/14/16 1029 8   Constitutional: Alert and oriented. Well appearing and in no acute distress. Eyes: Conjunctivae are normal. Head: Atraumatic. Nose: No congestion/rhinnorhea. Mouth/Throat: Mucous membranes are moist.  Oropharynx non-erythematous. Neck: No stridor.  Cardiovascular: Normal rate, regular rhythm. Good peripheral circulation. Grossly normal heart sounds.   Respiratory: Normal respiratory effort.  No retractions. Lungs CTAB. Gastrointestinal: Soft with mild LLQ tenderness. Positive left flank tenderness to percussion. No distention.  Musculoskeletal: No lower extremity tenderness nor edema. No gross deformities of extremities. Neurologic:  Normal speech and language. No gross focal neurologic deficits are appreciated.  Skin:  Skin is warm, dry and intact. No rash noted. Psychiatric: Mood and affect are normal. Speech and behavior are normal.  ____________________________________________   LABS (all labs ordered are listed, but only abnormal results are displayed)  Labs Reviewed  URINALYSIS, ROUTINE W REFLEX MICROSCOPIC   ____________________________________________  RADIOLOGY  Ct Renal Stone Study  Result Date: 10/14/2016 CLINICAL DATA:  Left flank pain, pain with urination, unsure if hematuria, hx renal stones 15+ yrs ago Hx: GB, Hysterectomy, APPY EXAM: CT ABDOMEN AND PELVIS WITHOUT CONTRAST TECHNIQUE: Multidetector CT imaging of the abdomen and pelvis was performed following the standard protocol without IV  contrast. COMPARISON:  None. FINDINGS: Lower chest: 4 mm nodule, subpleural, lateral right lower lobe, image 6, series 21. Lung bases otherwise clear. Heart normal size. Hepatobiliary: No focal liver abnormality is seen. Status post cholecystectomy. No biliary dilatation. Pancreas: Unremarkable. No pancreatic ductal dilatation or surrounding inflammatory changes. Spleen: Normal in size without focal abnormality. Adrenals/Urinary Tract: No adrenal masses. Kidneys are normal size, orientation and position. No renal masses or stones. No hydronephrosis. Normal ureters. Normal bladder. Stomach/Bowel: Stomach, small bowel and colon are unremarkable Vascular/Lymphatic: No significant vascular findings are present. No enlarged abdominal or pelvic lymph nodes. Reproductive: Status post hysterectomy. No adnexal masses. Other: No abdominal wall hernia or abnormality. No abdominopelvic ascites. Musculoskeletal: No acute or significant osseous findings. IMPRESSION: 1. No acute findings within the abdomen pelvis. No renal or ureteral stones or obstructive uropathy. No findings to account for left flank pain. 2. Status post cholecystectomy.  Status post hysterectomy. Electronically Signed   By: Amie Portland M.D.   On: 10/14/2016 11:24    ____________________________________________   PROCEDURES  Procedure(s) performed:   Procedures  None ____________________________________________   INITIAL IMPRESSION / ASSESSMENT AND PLAN / ED COURSE  Pertinent labs & imaging results that were available during my care of the patient were reviewed by me and considered in my medical decision making (see chart for details).  Patient resents emergency department for evaluation of left flank pain over the past 3 days associated dysuria. Urinalysis is negative for infection or blood. CT renal protocol negative for stones. No secondary evidence of obstructing stone from radiopaque source. Plan for MSK pain medication and close  primary care physician follow-up. Low suspicion for vascular etiology of pain in this case. Patient is having improved pain control after Toradol.  Normal UA and CT renal. Patient with possible MSK pain. No vaginal bleeding or discharge. Has had cholecystectomy and hysterectomy.   At this time, I do not feel there is any life-threatening condition present. I have reviewed and discussed all results (EKG, imaging, lab, urine as appropriate), exam findings with patient. I have reviewed nursing notes and appropriate previous records.  I feel the patient is safe to be discharged home without further emergent workup. Discussed usual and customary return precautions. Patient and family (if present) verbalize understanding and are comfortable with this plan.  Patient will follow-up with their primary care provider. If they do not have a primary care provider, information for follow-up has been provided to them. All questions have been answered.  ____________________________________________  FINAL CLINICAL IMPRESSION(S) / ED DIAGNOSES  Final diagnoses:  Flank pain     MEDICATIONS GIVEN DURING THIS VISIT:  Medications  ketorolac (TORADOL) 30 MG/ML injection 30 mg (30 mg Intravenous Given 10/14/16 1117)     NEW OUTPATIENT MEDICATIONS STARTED DURING THIS VISIT:  Discharge Medication List as of 10/14/2016 11:37 AM    START taking these medications   Details  methocarbamol (ROBAXIN) 500 MG tablet Take 1 tablet (500 mg total) by mouth 2 (two) times daily., Starting Sun 10/14/2016, Print        Note:  This document was prepared using Dragon voice recognition software and may include unintentional dictation errors.  Alona Bene, MD Emergency Medicine  Maia PlanLong, Joshua G, MD 10/15/16 [phone]

## 2016-10-14 NOTE — Discharge Instructions (Signed)

## 2017-08-17 DIAGNOSIS — D242 Benign neoplasm of left breast: Secondary | ICD-10-CM | POA: Insufficient documentation

## 2017-08-17 DIAGNOSIS — R920 Mammographic microcalcification found on diagnostic imaging of breast: Secondary | ICD-10-CM | POA: Insufficient documentation

## 2017-08-17 DIAGNOSIS — N6001 Solitary cyst of right breast: Secondary | ICD-10-CM | POA: Insufficient documentation

## 2017-09-08 IMAGING — CT CT RENAL STONE PROTOCOL
2 of 4 series · 16 of 46 positions shown, 18 images · non-contrast
Comparison: None.

CLINICAL DATA: Left flank pain, pain with urination, unsure if
hematuria, hx renal stones 15+ yrs ago

Hx: GB, Hysterectomy, APPY
EXAM:
CT ABDOMEN AND PELVIS WITHOUT CONTRAST
TECHNIQUE: Multidetector CT imaging of the abdomen and pelvis was performed
following the standard protocol without IV contrast.

[Series 2: axial st · axial · 0.88mm/px · z∈[-478,-43]mm · 13 of 95 slices shown, 15 images]
[im 4/95  soft-tissue]
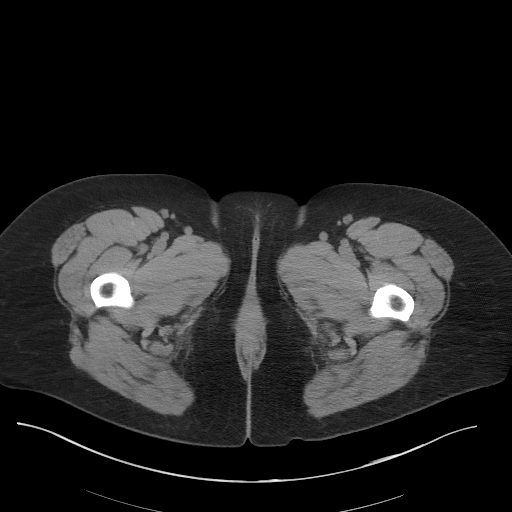
[im 4/95  bone]
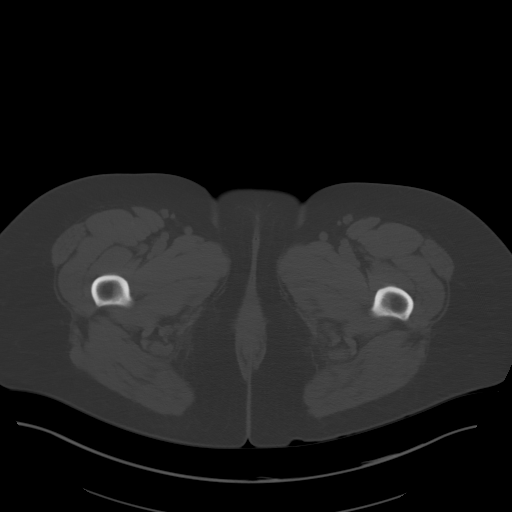
[im 12/95  soft-tissue]
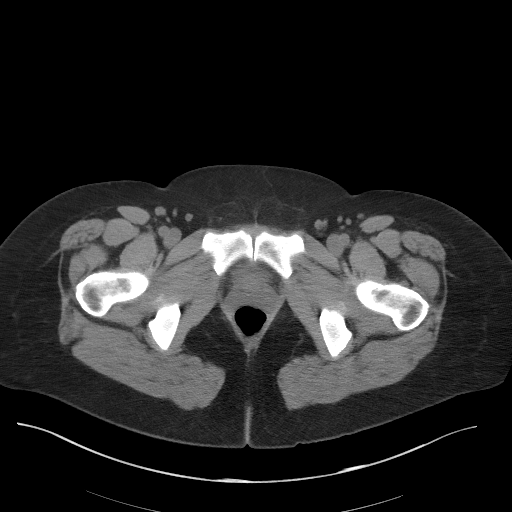
[im 19/95  soft-tissue]
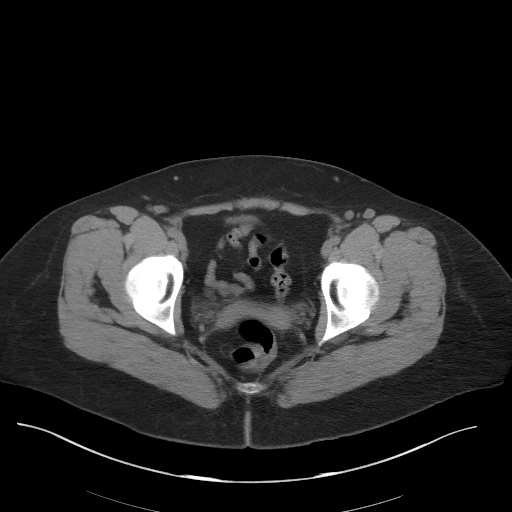
[im 27/95  soft-tissue]
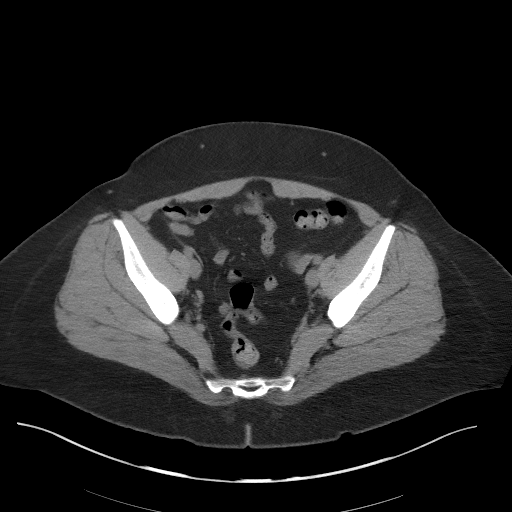
[im 34/95  soft-tissue]
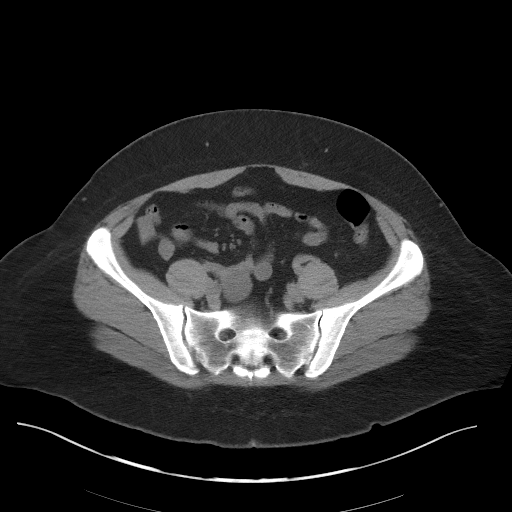
[im 42/95  soft-tissue]
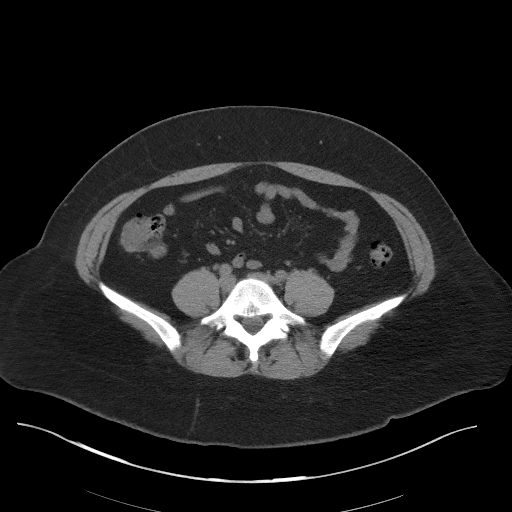
[im 49/95  soft-tissue]
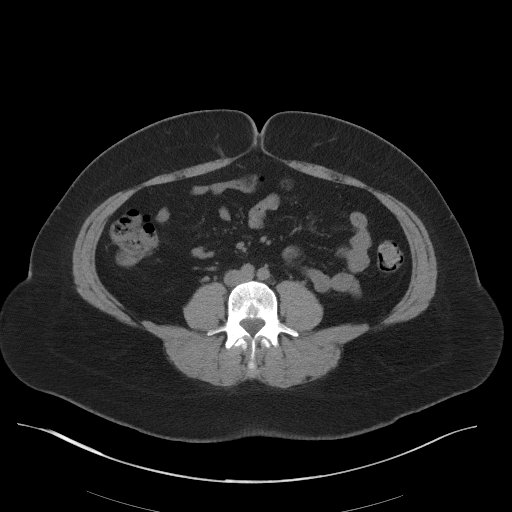
[im 53/95  soft-tissue]
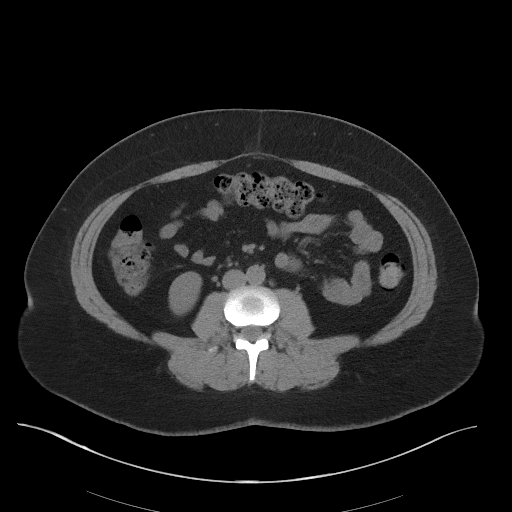
[im 61/95  soft-tissue]
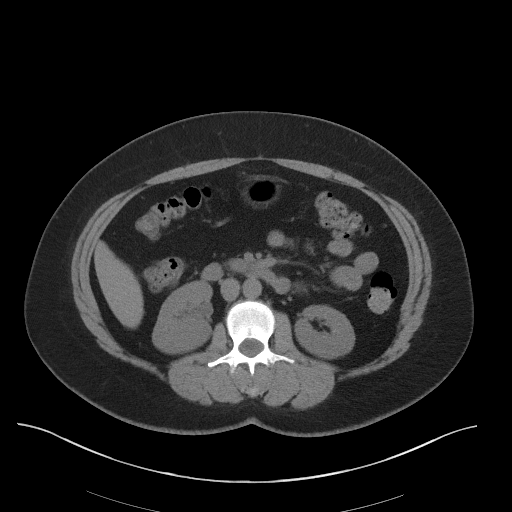
[im 61/95  bone]
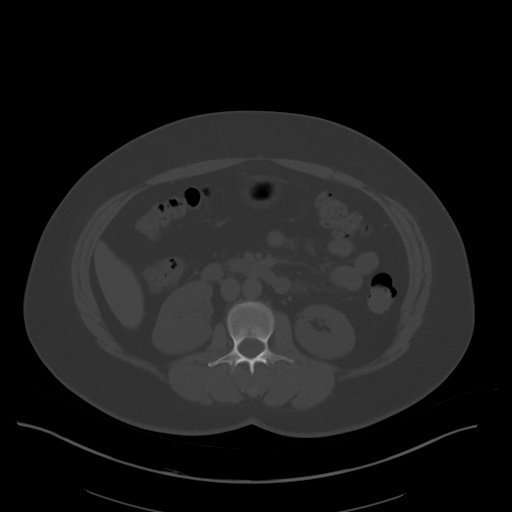
[im 68/95  soft-tissue]
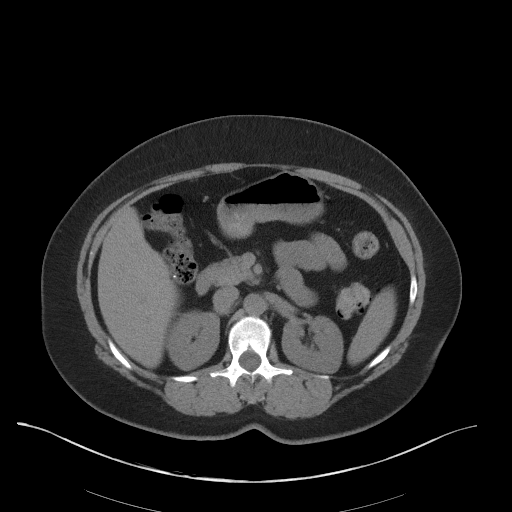
[im 76/95  soft-tissue]
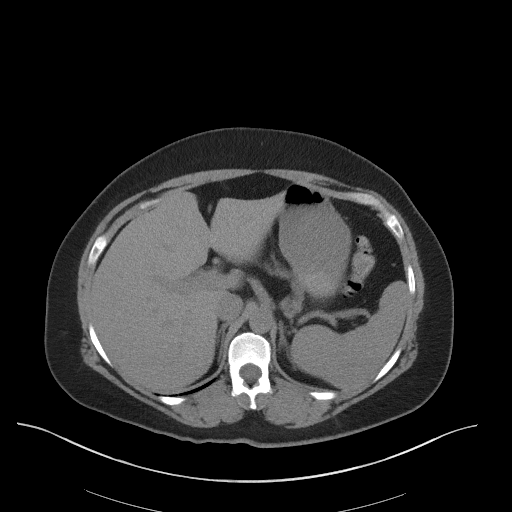
[im 83/95  soft-tissue]
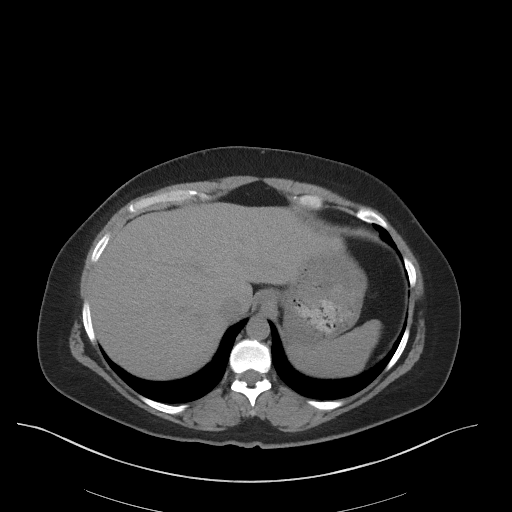
[im 91/95  soft-tissue]
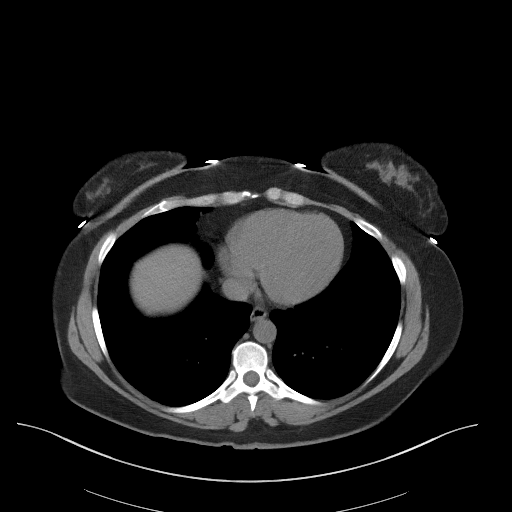

[Series 5: coronal st · coronal · 0.84mm/px · 3 of 96 slices shown]
[im 32/96  soft-tissue]
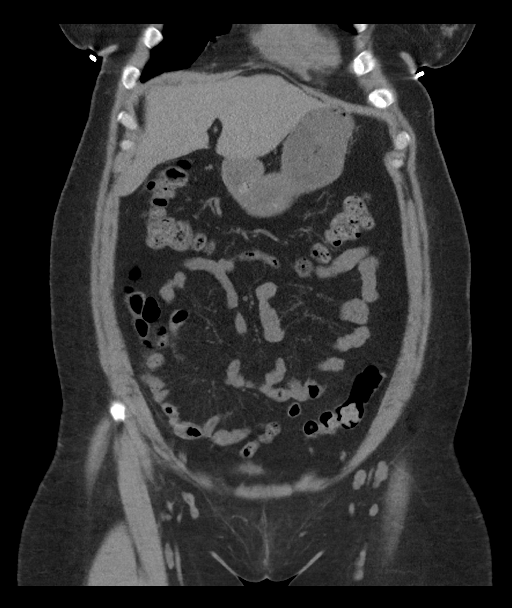
[im 43/96  soft-tissue]
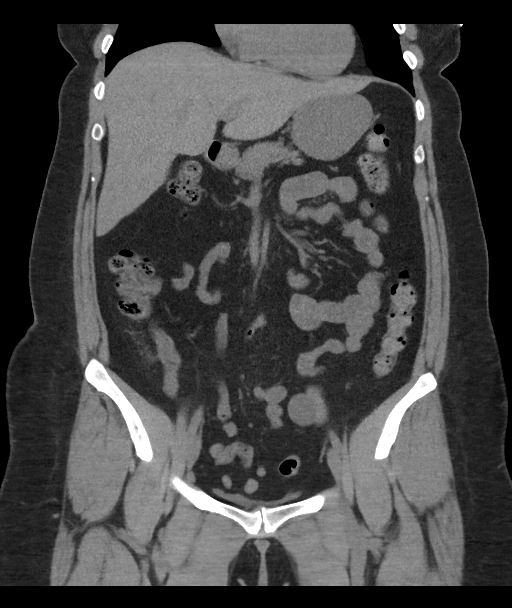
[im 53/96  soft-tissue]
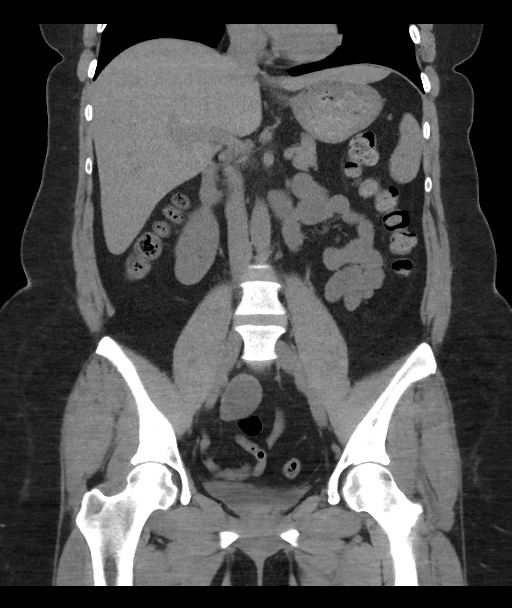

[16 of 46 positions shown; findings below may reference images not displayed]

FINDINGS: Lower chest: 4 mm nodule, subpleural, lateral right lower lobe,
image 6, series 21. Lung bases otherwise clear. Heart normal size.

Hepatobiliary: No focal liver abnormality is seen. Status post
cholecystectomy. No biliary dilatation.

Pancreas: Unremarkable. No pancreatic ductal dilatation or
surrounding inflammatory changes.

Spleen: Normal in size without focal abnormality.

Adrenals/Urinary Tract: No adrenal masses. Kidneys are normal size,
orientation and position. No renal masses or stones. No
hydronephrosis. Normal ureters. Normal bladder.

Stomach/Bowel: Stomach, small bowel and colon are unremarkable

Vascular/Lymphatic: No significant vascular findings are present. No
enlarged abdominal or pelvic lymph nodes.

Reproductive: Status post hysterectomy. No adnexal masses.

Other: No abdominal wall hernia or abnormality. No abdominopelvic
ascites.

Musculoskeletal: No acute or significant osseous findings.
IMPRESSION: 1. No acute findings within the abdomen pelvis. No renal or ureteral
stones or obstructive uropathy. No findings to account for left
flank pain.
2. Status post cholecystectomy.  Status post hysterectomy.

## 2017-10-24 DIAGNOSIS — N6489 Other specified disorders of breast: Secondary | ICD-10-CM | POA: Insufficient documentation

## 2019-11-25 DIAGNOSIS — M7751 Other enthesopathy of right foot: Secondary | ICD-10-CM | POA: Insufficient documentation

## 2019-11-25 DIAGNOSIS — M21611 Bunion of right foot: Secondary | ICD-10-CM | POA: Insufficient documentation

## 2021-05-28 HISTORY — PX: ROTATOR CUFF REPAIR: SHX139

## 2021-12-12 DIAGNOSIS — M67912 Unspecified disorder of synovium and tendon, left shoulder: Secondary | ICD-10-CM | POA: Insufficient documentation

## 2021-12-12 DIAGNOSIS — M19012 Primary osteoarthritis, left shoulder: Secondary | ICD-10-CM | POA: Insufficient documentation

## 2021-12-12 DIAGNOSIS — M7542 Impingement syndrome of left shoulder: Secondary | ICD-10-CM | POA: Insufficient documentation

## 2022-07-28 DIAGNOSIS — R519 Headache, unspecified: Secondary | ICD-10-CM | POA: Insufficient documentation

## 2022-07-29 ENCOUNTER — Other Ambulatory Visit: Payer: Self-pay

## 2022-07-29 ENCOUNTER — Emergency Department (HOSPITAL_BASED_OUTPATIENT_CLINIC_OR_DEPARTMENT_OTHER): Payer: No Typology Code available for payment source

## 2022-07-29 ENCOUNTER — Encounter (HOSPITAL_BASED_OUTPATIENT_CLINIC_OR_DEPARTMENT_OTHER): Payer: Self-pay | Admitting: Urology

## 2022-07-29 ENCOUNTER — Emergency Department (HOSPITAL_BASED_OUTPATIENT_CLINIC_OR_DEPARTMENT_OTHER)
Admission: EM | Admit: 2022-07-29 | Discharge: 2022-07-29 | Disposition: A | Discharge status: Home / Self Care | Payer: No Typology Code available for payment source | Attending: Emergency Medicine | Admitting: Emergency Medicine

## 2022-07-29 DIAGNOSIS — G43809 Other migraine, not intractable, without status migrainosus: Secondary | ICD-10-CM | POA: Diagnosis not present

## 2022-07-29 DIAGNOSIS — R519 Headache, unspecified: Secondary | ICD-10-CM | POA: Diagnosis present

## 2022-07-29 NOTE — ED Triage Notes (Addendum)
Pt states headache since yesterday  Had virtual visit with pcp today and was sent here  Starts in center of left eye, pulsates and gets bigger Had some numbness to tongue and finger tip yesterday but has resolved.  Denies any N/V, reports sensitive to light  Got new contacts 1 weeks ago and has eye appointment tomorrow   BEFAST negative, NAD, GCS 15

## 2022-07-29 NOTE — ED Notes (Signed)
Discharge paperwork reviewed entirely with patient, including Rx's and follow up care. Pain was under control. Pt verbalized understanding as well as all parties involved. No questions or concerns voiced at the time of discharge. No acute distress noted.   Pt ambulated out to PVA without incident or assistance.

## 2022-07-29 NOTE — ED Provider Notes (Signed)
[facility] Provider Note   CSN: [id number] Arrival date & time: 07/29/22  1325     History  Chief Complaint  Patient presents with   Headache    Katrina Elliott is a 52 y.o. female.  Here with headache.  Headache now mostly resolved.  Last 2 days she has had bright floater in the left eye followed by headache afterwards.  She has a remote history of migraines.  She used to get migraines in the past with tingling in her hands.  She had some aspect of that yesterday but what preceded the symptoms was in her left eye she had a bright prism/floater type process that occurred for about 15 minutes and about an hour later she developed a headache.  This happened again today.  She recently had the contacts placed.  She has had no vision loss or weakness or other stroke symptoms.  No difficulty with speech during that time.  Has been a long time since she has had headaches.  Her daughter has complex migraines as well.  She has follow-up with ophthalmology this week as well as primary care doctor.  She was sent for head CT.  The history is provided by the patient.       Home Medications Prior to Admission medications   Medication Sig Start Date End Date Taking? Authorizing Provider  ibuprofen (ADVIL,MOTRIN) 800 MG tablet Take 1 tablet (800 mg total) by mouth every 8 (eight) hours as needed. 10/14/16   Long, Wonda Olds, MD  loratadine (CLARITIN) 10 MG tablet Take 1 tablet (10 mg total) by mouth daily. 06/19/15   Julianne Rice, MD  methocarbamol (ROBAXIN) 500 MG tablet Take 1 tablet (500 mg total) by mouth 2 (two) times daily. 10/14/16   Long, Wonda Olds, MD  metoCLOPramide (REGLAN) 10 MG tablet Take 1 tablet (10 mg total) by mouth every 6 (six) hours as needed for nausea or vomiting (headache). 06/19/15   Julianne Rice, MD  mometasone (NASONEX) 50 MCG/ACT nasal spray Place 2 sprays into the nose daily. 06/19/15   Julianne Rice, MD  Multiple  Vitamins-Minerals (MULTIVITAMIN WITH MINERALS) tablet Take 1 tablet by mouth daily.    [provider]  oxymetazoline (AFRIN NASAL SPRAY) 0.05 % nasal spray Place 2 sprays into both nostrils 2 (two) times daily. Use no more than 3 days 06/19/15   Julianne Rice, MD  Probiotic Product (PROBIOTIC ADVANCED PO) Take by mouth.    [provider]      Allergies    Patient has no known allergies.    Review of Systems   Review of Systems  Physical Exam Updated Vital Signs BP (!) 141/85 (BP Location: Left Arm)   Pulse 86   Temp 98.4 F (36.9 C) (Oral)   Resp 20   Ht '5\' 3"'$  (1.6 m)   Wt 86.2 kg   SpO2 100%   BMI 33.66 kg/m  Physical Exam Vitals and nursing note reviewed.  Constitutional:      General: She is not in acute distress.    Appearance: She is well-developed. She is not ill-appearing.  HENT:     Head: Normocephalic and atraumatic.  Eyes:     General: No visual field deficit.    Extraocular Movements: Extraocular movements intact.     Right eye: Normal extraocular motion and no nystagmus.     Left eye: Normal extraocular motion and no nystagmus.     Conjunctiva/sclera: Conjunctivae normal.  Pupils: Pupils are equal, round, and reactive to light.  Cardiovascular:     Rate and Rhythm: Normal rate and regular rhythm.     Heart sounds: No murmur heard. Pulmonary:     Effort: Pulmonary effort is normal. No respiratory distress.     Breath sounds: Normal breath sounds.  Abdominal:     Palpations: Abdomen is soft.     Tenderness: There is no abdominal tenderness.  Musculoskeletal:        General: No swelling.     Cervical back: Normal range of motion and neck supple.  Skin:    General: Skin is warm and dry.     Capillary Refill: Capillary refill takes less than 2 seconds.  Neurological:     Mental Status: She is alert and oriented to person, place, and time.     Cranial Nerves: No cranial nerve deficit, dysarthria or facial asymmetry.     Sensory: No  sensory deficit.     Motor: No weakness.     Coordination: Romberg sign negative. Coordination normal.     Comments: Normal strength and sensation, normal visual field, normal speech, no facial droop  Psychiatric:        Mood and Affect: Mood normal.     ED Results / Procedures / Treatments   Labs (all labs ordered are listed, but only abnormal results are displayed) Labs Reviewed - No data to display  EKG None  Radiology CT Head Wo Contrast  Result Date: 07/29/2022 CLINICAL DATA:  Headache EXAM: CT HEAD WITHOUT CONTRAST TECHNIQUE: Contiguous axial images were obtained from the base of the skull through the vertex without intravenous contrast. RADIATION DOSE REDUCTION: This exam was performed according to the departmental dose-optimization program which includes automated exposure control, adjustment of the mA and/or kV according to patient size and/or use of iterative reconstruction technique. COMPARISON:  None Available. FINDINGS: Brain: No evidence of acute infarction, hemorrhage, hydrocephalus, extra-axial collection or mass lesion/mass effect. Vascular: No hyperdense vessel or unexpected calcification. Skull: Normal. Negative for fracture or focal lesion. Sinuses/Orbits: No acute finding. Other: None. IMPRESSION: No acute intracranial pathology. Electronically Signed   By: Delanna Ahmadi M.D.   On: 07/29/2022 15:34    Procedures Procedures    Medications Ordered in ED Medications - No data to display  ED Course/ Medical Decision Making/ A&P                             Medical Decision Making Amount and/or Complexity of Data Reviewed Radiology: ordered.   Katrina Elliott is here with headache.  Symptoms mostly resolved now following Motrin at home.  She has a history of migraines.  Spent a long time since she has had a headache.  Yesterday and today she had about 10 to 15 minutes of bright flash/zigzags in her left eye that was followed by headache.  She had some tingling in  her left hand but she has had that before with her headaches.  She had a head CT done today that per my review and interpretation shows no acute findings.  Radiology report with the same.  She overall has a normal neurological exam.  I doubt that this is MS or stroke and this sounds like a complex migraine.  Sounds like she has a history of this but now there seems to be an ocular component.  She is overall at her baseline and think that she can further pursue outpatient follow-up with  neurology.  She has ophthalmology appointment as well.  This started after she was started on the contacts.  Overall she appears well.  She has no fever.  Have no concern for infectious process.  Have no concern for stroke.  Discharged in good condition.  This chart was dictated using voice recognition software.  Despite best efforts to proofread,  errors can occur which can change the documentation meaning.         Final Clinical Impression(s) / ED Diagnoses Final diagnoses:  Other migraine without status migrainosus, not intractable    Rx / DC Orders ED Discharge Orders          Ordered    Ambulatory referral to Neurology       Comments: An appointment is requested in approximately: 1 week   07/29/22 [place], [place], DO 07/29/22 1707

## 2022-07-31 ENCOUNTER — Ambulatory Visit: Payer: No Typology Code available for payment source | Admitting: Psychiatry

## 2022-07-31 ENCOUNTER — Encounter: Payer: Self-pay | Admitting: Psychiatry

## 2022-07-31 VITALS — BP 116/76 | HR 76 | Ht 63.0 in | Wt 201.6 lb

## 2022-07-31 DIAGNOSIS — G43101 Migraine with aura, not intractable, with status migrainosus: Secondary | ICD-10-CM

## 2022-07-31 MED ORDER — METHYLPREDNISOLONE 4 MG PO TBPK: ORAL_TABLET | ORAL | 0 refills | Status: DC

## 2022-07-31 MED ORDER — RIZATRIPTAN BENZOATE 10 MG PO TABS: 10.0000 mg | ORAL_TABLET | ORAL | 6 refills | Status: DC | PRN

## 2022-07-31 NOTE — Patient Instructions (Addendum)
Short course of steroids to break migraine cycle Take rizatriptan as needed for migraine. Take one pill at first sign of migraine. May repeat a dose in 2 hours if needed. Start magnesium oxide 500 mg daily for headache and aura prevention  Natural supplements that can reduce migraines: Magnesium Oxide or Magnesium Glycinate 500 mg at bedtime Coenzyme Q10 300 mg in AM Vitamin B2- 200 mg twice a day  Add 1 supplement at a time since even natural supplements can have undesirable side effects. You can sometimes buy supplements cheaper (especially Coenzyme Q10) at www.[email or web address]://compton-perez.com/ or at [employer].  Magnesium: Magnesium (250 mg twice a day or 500 mg at bed) has a relaxant effect on smooth muscles such as blood vessels. Individuals suffering from frequent or daily headache usually have low magnesium levels which can be increase with daily supplementation of 400-800 mg. Three trials found 40-90% average headache reduction  when used as a preventative. Magnesium also demonstrated the benefit in menstrually related migraine.  Magnesium is part of the messenger system in the serotonin cascade and it is a good muscle relaxant.  It is also useful for constipation. Good sources include nuts, whole grains, and tomatoes. Side Effects: loose stool/diarrhea  Riboflavin (vitamin B 2) 200 mg twice a day. This vitamin assists nerve cells in the production of ATP a principal energy storing molecule.  It is necessary for many chemical reactions in the body.  There have been at least 3 clinical trials of riboflavin using 400 mg per day all of which suggested that migraine frequency can be decreased.  All 3 trials showed significant improvement in over half of migraine sufferers.  The supplement is found in bread, cereal, milk, meat, and poultry.  Most Americans get more riboflavin than the recommended daily allowance, however riboflavin deficiency is not necessary for the supplements to help prevent headache. Side effects:  energizing, green urine  Coenzyme Q10: This is present in almost all cells in the body and is critical component for the conversion of energy.  Recent studies have shown that a nutritional supplement of CoQ10 can reduce the frequency of migraine attacks by improving the energy production of cells as with riboflavin.  Doses of 150 mg twice a day or 300 mg in the morning have been shown to be effective.

## 2022-07-31 NOTE — Progress Notes (Signed)
Referring:  Lennice Sites, DO [address],  [place] [id number]  PCP: Robyne Peers, MD  Neurology was asked to evaluate Katrina Elliott, a 52 year old female for a chief complaint of headaches.  Our recommendations of care will be communicated by shared medical record.    CC:  headaches  History provided from self  HPI:  Medical co-morbidities: kidney stone  The patient presents for evaluation of headaches and vision changes which began 5 days ago. A few days ago she developed vision changes described as prisms in her left eye, which started in the center of her vision and spread to her periphery. This lasted 15-20 minutes. She also developed numbness in her fingers and tongue for ~5 minutes. This was followed by a severe headache which was associated with photophobia and phonophobia. She presented to the ED 07/29/22 where [facility] was normal.   She has had 5 episodes of visual migraine aura this month. The episodes are all followed by a migraine headache. Headaches can last for several hours at a time. She is alternating Advil and Tylenol, which helps somewhat but doesn't resolve the headache.  In terms of potential triggers, she notes that had strep throat and the flu a few weeks ago. Does also endorse a lot of stress with work right now. She also thinks she might be starting to go through menopause as she has started to have hot flashes.  She has a history of migraines, though she had been headache-free for several years prior to this episode. Previously presented to the ED in 2017 for migraine with associated numbness in the right hand and "squiggly lines" in her vision. States she had not had a migraine since this time.  Headache History: Onset: March 2023 Aura: lights in vision, numbness Associated Symptoms:  Photophobia: yes  Phonophobia: yes  Nausea: no Worse with activity?: yes Duration of headaches: several hours  Migraine days per month: 5 Headache free days per  month: 25  Current Treatment: Abortive Tyenol ibuprofen  Preventative none  Prior Therapies                                 Robaxin 500 mg PRN Topamax contraindicated due to kidney stones  LABS: CBC    Component Value Date/Time   WBC 13.6 (H) 02/21/2013 2044   RBC 4.63 02/21/2013 2044   HGB 13.3 02/21/2013 2044   HCT 40.4 02/21/2013 2044   PLT 400 02/21/2013 2044   MCV 87.3 02/21/2013 2044   MCH 28.7 02/21/2013 2044   MCHC 32.9 02/21/2013 2044   RDW 13.7 02/21/2013 2044   LYMPHSABS 4.1 (H) 02/21/2013 2044   MONOABS 1.1 (H) 02/21/2013 2044   EOSABS 0.3 02/21/2013 2044   BASOSABS 0.0 02/21/2013 2044      Latest Ref Rng & Units 02/21/2013    8:44 PM 06/29/2010   11:33 PM 06/29/2010    4:54 PM  CMP  Glucose 70 - 99 mg/dL 102  109  93   BUN 6 - 23 mg/dL '12  9  12   '$ Creatinine 0.50 - 1.10 mg/dL 0.90  0.89  0.8   Sodium 135 - 145 mEq/L 139  136  144   Potassium 3.5 - 5.1 mEq/L 4.0  3.4  3.8   Chloride 96 - 112 mEq/L 102  105  106   CO2 19 - 32 mEq/L 26  23  26  Calcium 8.4 - 10.5 mg/dL 9.7  8.9  9.7   Total Protein 6.0 - 8.3 g/dL  6.9    Total Bilirubin 0.3 - 1.2 mg/dL  0.3    Alkaline Phos 39 - 117 U/L  70    AST 0 - 37 U/L  18    ALT 0 - 35 U/L  15       IMAGING:  [facility] 07/29/22: unremarkable  Imaging independently reviewed on July 31, 2022   Current Outpatient Medications on File Prior to Visit  Medication Sig Dispense Refill   ibuprofen (ADVIL,MOTRIN) 800 MG tablet Take 1 tablet (800 mg total) by mouth every 8 (eight) hours as needed. 21 tablet 0   Multiple Vitamins-Minerals (MULTIVITAMIN WITH MINERALS) tablet Take 1 tablet by mouth daily.     Probiotic Product (PROBIOTIC ADVANCED PO) Take by mouth.     loratadine (CLARITIN) 10 MG tablet Take 1 tablet (10 mg total) by mouth daily. (Patient not taking: Reported on 07/31/2022) 30 tablet 0   methocarbamol (ROBAXIN) 500 MG tablet Take 1 tablet (500 mg total) by mouth 2 (two) times daily. (Patient not taking: Reported  on 07/31/2022) 20 tablet 0   metoCLOPramide (REGLAN) 10 MG tablet Take 1 tablet (10 mg total) by mouth every 6 (six) hours as needed for nausea or vomiting (headache). (Patient not taking: Reported on 07/31/2022) 30 tablet 0   mometasone (NASONEX) 50 MCG/ACT nasal spray Place 2 sprays into the nose daily. 17 g 12   oxymetazoline (AFRIN NASAL SPRAY) 0.05 % nasal spray Place 2 sprays into both nostrils 2 (two) times daily. Use no more than 3 days 30 mL 0   No current facility-administered medications on file prior to visit.     Allergies: No Known Allergies  Family History: Migraine or other headaches in the family:  sister and daughter have migraines Aneurysms in a first degree relative:  no Brain tumors in the family:  no Other neurological illness in the family:   daughter had seizures as a child  Past Medical History: Past Medical History:  Diagnosis Date   Kidney stone 2003   Migraine     Past Surgical History Past Surgical History:  Procedure Laterality Date   ABDOMINAL HYSTERECTOMY     APPENDECTOMY     CESAREAN SECTION     CHOLECYSTECTOMY     ROTATOR CUFF REPAIR  2023   TONSILLECTOMY      Social History: Social History   Tobacco Use   Smoking status: Never   Smokeless tobacco: Never  Substance Use Topics   Alcohol use: No   Drug use: No    ROS: Negative for fevers, chills. Positive for headaches, vision changes. All other systems reviewed and negative unless stated otherwise in HPI.   Physical Exam:   Vital Signs: BP 116/76   Pulse 76   Ht '5\' 3"'$  (1.6 m)   Wt 201 lb 9.6 oz (91.4 kg)   BMI 35.71 kg/m  GENERAL: well appearing,in no acute distress,alert SKIN:  Color, texture, turgor normal. No rashes or lesions HEAD:  Normocephalic/atraumatic. CV:  RRR RESP: Normal respiratory effort MSK: no tenderness to palpation over occiput, neck, or shoulders  NEUROLOGICAL: Mental Status: Alert, oriented to person, place and time,Follows commands Cranial Nerves:  PERRL, visual fields intact to confrontation, extraocular movements intact, facial sensation intact, no facial droop or ptosis, hearing grossly intact, no dysarthria Motor: muscle strength 5/5 both upper and lower extremities Reflexes: 2+ throughout Sensation: intact to light touch all  4 extremities Coordination: Finger-to- nose-finger intact bilaterally Gait: normal-based   IMPRESSION: 52 year old female with a history of kidney stones who presents for evaluation of headaches and vision changes. Her headache pattern is most consistent with migraine with aura. CTH and neurological exam are normal. Will prescribe a short course of steroids to help break current migraine cycle. Maxalt prescribed for migraine rescue. Discussed supplement options including magnesium for migraine and aura prevention. Would consider brain MRI and daily migraine preventive if auras and headaches continue at high frequency despite migraine treatment.  PLAN: -Medrol dosepak -Start Maxalt 10 mg PRN for migraine rescue -Start magnesium oxide 500 mg daily for headache/aura prevention   I spent a total of 33 minutes chart reviewing and counseling the patient. Headache education was done. Discussed treatment options including acute medications and natural supplements. Discussed medication side effects, adverse reactions and drug interactions. Written educational materials and patient instructions outlining all of the above were given.  Follow-up: 8 months   Genia Harold, MD 07/31/2022   3:12 PM

## 2022-08-01 ENCOUNTER — Ambulatory Visit: Payer: 59 | Admitting: Psychiatry

## 2022-12-27 ENCOUNTER — Telehealth: Payer: Self-pay | Admitting: Psychiatry

## 2022-12-27 NOTE — Telephone Encounter (Signed)
LVM and sent mychart msg informing pt of need to reschedule 04/01/23 appt - MD leaving practice

## 2023-04-01 ENCOUNTER — Ambulatory Visit: Payer: No Typology Code available for payment source | Admitting: Psychiatry

## 2023-04-11 DIAGNOSIS — K219 Gastro-esophageal reflux disease without esophagitis: Secondary | ICD-10-CM | POA: Insufficient documentation

## 2023-04-11 DIAGNOSIS — J300 Vasomotor rhinitis: Secondary | ICD-10-CM | POA: Insufficient documentation

## 2023-04-11 DIAGNOSIS — R490 Dysphonia: Secondary | ICD-10-CM | POA: Insufficient documentation

## 2023-04-11 DIAGNOSIS — H9041 Sensorineural hearing loss, unilateral, right ear, with unrestricted hearing on the contralateral side: Secondary | ICD-10-CM | POA: Insufficient documentation

## 2023-06-06 DIAGNOSIS — E7800 Pure hypercholesterolemia, unspecified: Secondary | ICD-10-CM | POA: Insufficient documentation

## 2023-06-07 DIAGNOSIS — G43119 Migraine with aura, intractable, without status migrainosus: Secondary | ICD-10-CM | POA: Insufficient documentation

## 2023-06-07 DIAGNOSIS — F439 Reaction to severe stress, unspecified: Secondary | ICD-10-CM | POA: Insufficient documentation

## 2023-09-11 ENCOUNTER — Ambulatory Visit: Admitting: Family Medicine

## 2023-09-11 ENCOUNTER — Encounter: Payer: Self-pay | Admitting: Family Medicine

## 2023-09-11 VITALS — BP 122/80 | HR 74 | Temp 98.3°F | Ht 63.0 in | Wt 206.5 lb

## 2023-09-11 DIAGNOSIS — F439 Reaction to severe stress, unspecified: Secondary | ICD-10-CM | POA: Diagnosis not present

## 2023-09-11 DIAGNOSIS — E7800 Pure hypercholesterolemia, unspecified: Secondary | ICD-10-CM | POA: Diagnosis not present

## 2023-09-11 DIAGNOSIS — G4700 Insomnia, unspecified: Secondary | ICD-10-CM | POA: Insufficient documentation

## 2023-09-11 DIAGNOSIS — G43119 Migraine with aura, intractable, without status migrainosus: Secondary | ICD-10-CM

## 2023-09-11 DIAGNOSIS — Z23 Encounter for immunization: Secondary | ICD-10-CM

## 2023-09-11 DIAGNOSIS — G4709 Other insomnia: Secondary | ICD-10-CM | POA: Diagnosis not present

## 2023-09-11 MED ORDER — AMITRIPTYLINE HCL 10 MG PO TABS
10.0000 mg | ORAL_TABLET | Freq: Every day | ORAL | 1 refills | Status: AC
Start: 2023-09-11 — End: ?

## 2023-09-11 NOTE — Progress Notes (Signed)
 Patient Office Visit  Assessment & Plan:  Other insomnia -     Amitriptyline HCl; Take 1 tablet (10 mg total) by mouth at bedtime. Take one hour prior to bedtime  Dispense: 90 tablet; Refill: 1  Hypercholesteremia  Intractable migraine with aura without status migrainosus  Situational stress  Need for shingles vaccine -     Varicella-zoster vaccine IM   Test results were reviewed and analyzed as part of the medical decision making of this visit.  Reviewed primary care notes from [facility] and lab work.  Also reviewed ENT and audiology consults that were done this year.  Patient will return for physical next time. Elavil 10 mg take at nighttime 1 hour prior to bedtime.  Continue Lexapro 10 mg once a day.  If she think she needs to increase the Lexapro to 20 mg she can send us  a MyChart message.  Shingrix No. 1 given today. Recommend healthy diet i.e mediterranean/DASH diet, consistent exercise - 30 minutes 5 day per week, and gradual weight loss.  Return in 2 months (on 11/11/2023), or if symptoms worsen or fail to improve, for physical.   Subjective:    Patient ID: Katrina Elliott, female    DOB: 1970-05-31  Age: 53 y.o. MRN: [id number]  Chief Complaint  Patient presents with   Medical Management of Chronic Issues   Establish Care    HPI Is here to establish primary care and discuss chronic medical issues include elevated cholesterol, chronic rhinitis, migraine headaches, anxiety,and weight issues.  Anxiety-patient has had increased stressors in the last several months i.e. her daughter did not get married after all, son who has Crohn's disease got a DUI and is not sure whether he will be charged or not.  Son may serve some prison time but not sure. No one died during the car accident. Patient is doing psycho therapy and trying to get assistance on how to deal with adult children who are living at home.  Patient states she would have not gotten through this if she had  not been on the Lexapro 10 mg once a day.  Patient does not think that she needs to increase the dosage at this time.  Due to increased stressors she has been eating a lot of sweets but hopefully she will backed off on this.  Patient has been working out which is positive. Insomnia-is having more difficulty turning her brain off due to the ongoing stressors.  Patient has not tried a sleep aid.  Patient does not want to take anything that will be addictive.  Patient has not tried trazodone or Elavil or Pamelor. MIGRAINE HEADACHE:  ONSET: few months, ongoing LOCATION: Diffuse TIMING: Subacute onset SEVERITY: moderate PROGRESSION: Stable, getting better overall. Pt has seen Neurology in the past CHARACTER: Pressure TRIGGERS: not sure but could be related to being perimenopausal. RELIEVED BY: Maxalt HOME TREATMENT: magnesium, Maxalt Prn ASSOCIATED SYMPTOMS: Subjective fever (No), Chills (No), Confusion (No), Vision Changes (Yes), Nausea without Vomiting (Yes), Photophobia (Yes), Phonophobia (Yes), Syncope (No), Nasal discharge (No), Nasal congestion (No), Lacrimation (No), Dizziness (No) RISK FACTORS: -- History of migraine (Yes) -- Family history of migraines (No) -- Trauma (No) -- Prior ICH or mass (No)  Preventative measures- Elavil or Pamelor or Topamax?  - has not tried these but cannot take Topamax because she has history of kidney stones. Has not tried new meds such as Nurtec Hyperlipidemia-not taking statin therapy.  Aware of need for diet control, exercise and healthy eating.  Chronic rhinitis-patient was using Astelin nasal spray but was not helping.  Patient does have hearing loss; patient did see ENT Dr. Valiant Gaul in [place] as well as audiology.  The 10-year ASCVD risk score (Arnett DK, et al., 2019) is: 1.8%  Past Medical History:  Diagnosis Date   GERD (gastroesophageal reflux disease)    Hyperlipidemia    Kidney stone 2003   Migraine    Migraines    Past Surgical History:   Procedure Laterality Date   ABDOMINAL HYSTERECTOMY     APPENDECTOMY     BREAST BIOPSY Bilateral 03/2011   BREAST LUMPECTOMY Left 09/24/2017   CESAREAN SECTION     CHOLECYSTECTOMY  2005   DISTAL CLAVICLE EXCISION Left 01/12/2022   Distal clavicle resection arthroscopic   LASER ABLATION     SHOULDER ARTHROSCOPY W/ ROTATOR CUFF REPAIR Left 01/12/2022   SHOULDER ARTHROSCOPY,SUBACROMIAL DECOMPRESSION, DISTAL CLAVICLE EXCISION Left 01/12/2022   TONSILLECTOMY     Social History   Tobacco Use   Smoking status: Never   Smokeless tobacco: Never  Vaping Use   Vaping status: Never Used  Substance Use Topics   Alcohol use: No   Drug use: No   Family History  Problem Relation Age of Onset   Hypertension Mother    Depression Sister    Hypothyroidism Sister    Seizures Daughter    Migraines Daughter    Crohn's disease Son    Breast cancer Maternal Grandmother    Breast cancer Paternal Grandmother    Ovarian cancer Neg Hx    Uterine cancer Neg Hx    Colon cancer Neg Hx    Heart disease Neg Hx    No Known Allergies  ROS    Objective:    BP 122/80   Pulse 74   Temp 98.3 F (36.8 C)   Ht 5\' 3"  (1.6 m)   Wt 206 lb 8 oz (93.7 kg)   SpO2 98%   BMI 36.58 kg/m  BP Readings from Last 3 Encounters:  09/11/23 122/80  07/31/22 116/76  07/29/22 (!) 141/85   Wt Readings from Last 3 Encounters:  09/11/23 206 lb 8 oz (93.7 kg)  07/31/22 201 lb 9.6 oz (91.4 kg)  07/29/22 190 lb 0.6 oz (86.2 kg)    Physical Exam Vitals and nursing note reviewed.  Constitutional:      Appearance: Normal appearance.  HENT:     Head: Normocephalic.     Right Ear: Tympanic membrane, ear canal and external ear normal.     Left Ear: Tympanic membrane, ear canal and external ear normal.  Eyes:     Extraocular Movements: Extraocular movements intact.     Conjunctiva/sclera: Conjunctivae normal.     Pupils: Pupils are equal, round, and reactive to light.  Cardiovascular:     Rate and Rhythm:  Normal rate and regular rhythm.     Heart sounds: Normal heart sounds.  Pulmonary:     Effort: Pulmonary effort is normal.     Breath sounds: Normal breath sounds.  Musculoskeletal:     Right lower leg: No edema.     Left lower leg: No edema.  Neurological:     General: No focal deficit present.     Mental Status: She is alert and oriented to person, place, and time.  Psychiatric:        Mood and Affect: Mood normal.        Behavior: Behavior normal.        Thought Content: Thought content normal.  Judgment: Judgment normal.      No results found for any visits on 09/11/23.

## 2023-09-13 ENCOUNTER — Other Ambulatory Visit (HOSPITAL_BASED_OUTPATIENT_CLINIC_OR_DEPARTMENT_OTHER): Payer: Self-pay | Admitting: Family Medicine

## 2023-09-13 DIAGNOSIS — Z1231 Encounter for screening mammogram for malignant neoplasm of breast: Secondary | ICD-10-CM

## 2023-09-19 ENCOUNTER — Ambulatory Visit (INDEPENDENT_AMBULATORY_CARE_PROVIDER_SITE_OTHER)

## 2023-09-19 DIAGNOSIS — Z1231 Encounter for screening mammogram for malignant neoplasm of breast: Secondary | ICD-10-CM

## 2023-11-12 ENCOUNTER — Encounter: Payer: Self-pay | Admitting: Family Medicine

## 2023-11-12 ENCOUNTER — Ambulatory Visit: Admitting: Family Medicine

## 2023-11-12 VITALS — BP 124/80 | HR 70 | Temp 98.1°F | Ht 63.0 in | Wt 208.1 lb

## 2023-11-12 DIAGNOSIS — F439 Reaction to severe stress, unspecified: Secondary | ICD-10-CM

## 2023-11-12 DIAGNOSIS — G4709 Other insomnia: Secondary | ICD-10-CM

## 2023-11-12 DIAGNOSIS — Z23 Encounter for immunization: Secondary | ICD-10-CM

## 2023-11-12 DIAGNOSIS — G43109 Migraine with aura, not intractable, without status migrainosus: Secondary | ICD-10-CM

## 2023-11-12 DIAGNOSIS — J300 Vasomotor rhinitis: Secondary | ICD-10-CM

## 2023-11-12 DIAGNOSIS — Z0001 Encounter for general adult medical examination with abnormal findings: Secondary | ICD-10-CM | POA: Diagnosis not present

## 2023-11-12 DIAGNOSIS — Z0000 Encounter for general adult medical examination without abnormal findings: Secondary | ICD-10-CM

## 2023-11-12 DIAGNOSIS — H9041 Sensorineural hearing loss, unilateral, right ear, with unrestricted hearing on the contralateral side: Secondary | ICD-10-CM

## 2023-11-12 MED ORDER — AMITRIPTYLINE HCL 25 MG PO TABS: 25.0000 mg | ORAL_TABLET | Freq: Every day | ORAL | 1 refills | Status: AC

## 2023-11-12 MED ORDER — RIZATRIPTAN BENZOATE 10 MG PO TABS: 10.0000 mg | ORAL_TABLET | ORAL | 5 refills | Status: AC | PRN

## 2023-11-12 NOTE — Progress Notes (Addendum)
 Complete physical exam  Assessment & Plan:    Routine Health Maintenance and Physical Exam Discussed health benefits of physical activity, and encouraged her to engage in regular exercise appropriate for her age and condition.  Preventative health care -     CBC -     TSH -     Lipid panel -     Comprehensive metabolic panel with GFR  Need for shingles vaccine -     Varicella-zoster vaccine IM  Other insomnia -     Amitriptyline  HCl; Take 1 tablet (25 mg total) by mouth at bedtime.  Dispense: 90 tablet; Refill: 1  Migraine with aura and without status migrainosus, not intractable -     Amitriptyline  HCl; Take 1 tablet (25 mg total) by mouth at bedtime.  Dispense: 90 tablet; Refill: 1 -     Rizatriptan  Benzoate; Take 1 tablet (10 mg total) by mouth as needed for migraine. May repeat in 2 hours if needed  Dispense: 10 tablet; Refill: 5  Situational stress  Vasomotor rhinitis -     Ambulatory referral to ENT  Sensorineural hearing loss (SNHL) of right ear with unrestricted hearing of left ear -     Ambulatory referral to ENT   Assessment & Plan Insomnia Amitriptyline  10 mg ineffective; 20 mg effective but unavailable. Increase to 25 mg agreed upon with monitoring for adverse effects. - Increase amitriptyline  to 25 mg for sleep. - Report any adverse effects if 25 mg is too strong.  Migraine Maxalt  effective at aura onset, preventing migraines and ER visits. Refill requested. - Prescribe Maxalt  (10 tablets) for migraine management. - Send prescription to [facility].  Depression and Anxiety Lexapro effective for anxiety and emotional regulation. Therapy ongoing for stress management. - Continue Lexapro for anxiety and depression. - Continue therapy for stress management.  Hearing Loss Reports hearing loss, dissatisfied with previous care. Referred to ENT for further evaluation. - Refer to ENT for further evaluation of hearing loss.  Vasomotor Rhinitis and  Silent Nocturnal Reflux Postnasal drip and silent reflux present. Prefers lifestyle modifications over medication. Aware of weight loss benefits. - Encourage weight loss and lifestyle modifications to manage reflux symptoms. - Consider medication for reflux if symptoms persist.  General Health Maintenance Up to date on screenings and vaccinations. Addressing weight gain with nutritionist and personal training. Therapy for stress management ongoing. - Administer second shingles vaccine. - Continue with nutritionist and personal training for weight management. - Continue therapy for stress management. Return in about 3 months (around 02/12/2024), or if symptoms worsen or fail to improve, for depression.        Subjective:  Patient ID: Katrina Elliott, female    DOB: 19-Jul-1970  Age: 53 y.o. MRN: [id number] Chief Complaint  Patient presents with   Annual Exam    Katrina Elliott is a 53 y.o. female who presents today for a complete physical exam. Colonoscopy-UTD Mammogram- UTD Tetanus UTD Shingrix -will get 2nd one today Katrina Elliott is a 53 year old female who presents for an annual physical exam.  She is up to date with her mammogram and colonoscopy, having had a mammogram two months ago and a colonoscopy two years ago. She had no issues with the first dose of the shingles vaccine and is scheduled to receive the second dose today. Tetanus vaccination is current as of 2018. She frequently donates blood, which includes screening for HIV and hepatitis. She is postmenopausal, confirmed with FSH levels.  She is experiencing weight gain  despite no changes in her diet or activity level. She has started working with a nutritionist and a Investment banker, operational to address this. She notes a craving for sweets, particularly in the afternoon, and often skips breakfast.  Lexapro is working well for her, improving her mood and anxiety. She was prescribed amitriptyline  for sleep issues, but finds that one tablet is  ineffective, while two tablets are sufficient. She has not experienced any dry mouth from the medication. She is dealing with stress related to family issues, particularly with her daughter, and has started psychotherapy to help manage this.  She has a history of migraines and previously used Maxalt , prescribed by a neurologist who is no longer available. She has not had a migraine in a while but would like to have Maxalt  on hand as it has been effective in preventing full-blown migraines when taken at the onset of symptoms. She used to experience nausea and vomiting with migraines but not since using Maxalt .  She has a history of hearing loss and has been evaluated by an audiologist. She experiences difficulty hearing in noisy environments. She experiences postnasal drip and silent reflux, which may contribute to her hearing issues. She prefers to attempt lifestyle modifications before starting medication for reflux. Physical Exam Results LABS FSH: 30.8 (2024) Glucose: Normal (2024)  RADIOLOGY Mammogram: Normal (08/2023)  DIAGNOSTIC Colonoscopy: Normal (10/2021) Audiology: Mild low-frequency loss on the left, hearing loss on the right (03/2023) Assessment & Plan Insomnia Amitriptyline  10 mg ineffective; 20 mg effective but unavailable. Increase to 25 mg agreed upon with monitoring for adverse effects. - Increase amitriptyline  to 25 mg for sleep. - Report any adverse effects if 25 mg is too strong.  Migraine Maxalt  effective at aura onset, preventing migraines and ER visits. Refill requested. - Prescribe Maxalt  (10 tablets) for migraine management. - Send prescription to [facility].  Depression and Anxiety Lexapro effective for anxiety and emotional regulation. Therapy ongoing for stress management. - Continue Lexapro for anxiety and depression. - Continue therapy for stress management.  Hearing Loss Reports hearing loss, dissatisfied with previous care. Referred to ENT for  further evaluation. - Refer to ENT for further evaluation of hearing loss.  Vasomotor Rhinitis and Silent Nocturnal Reflux Postnasal drip and silent reflux present. Prefers lifestyle modifications over medication. Aware of weight loss benefits. - Encourage weight loss and lifestyle modifications to manage reflux symptoms. - Consider medication for reflux if symptoms persist.  General Health Maintenance Up to date on screenings and vaccinations. Addressing weight gain with nutritionist and personal training. Therapy for stress management ongoing. - Administer second shingles vaccine. - Continue with nutritionist and personal training for weight management. - Continue therapy for stress management. The 10-year ASCVD risk score (Arnett DK, et al., 2019) is: 1.9%   Values used to calculate the score:     Age: 63 years     Clincally relevant sex: Female     Is Non-Hispanic African American: No     Diabetic: No     Tobacco smoker: No     Systolic Blood Pressure: 124 mmHg     Is BP treated: No     HDL Cholesterol: 52 mg/dL     Total Cholesterol: 241 mg/dL The 40-JWJX ASCVD risk score (Arnett DK, et al., 2019) is: 2%   Values used to calculate the score:     Age: 80 years     Clincally relevant sex: Female     Is Non-Hispanic African American: No  Diabetic: No     Tobacco smoker: No     Systolic Blood Pressure: 124 mmHg     Is BP treated: No     HDL Cholesterol: 48 mg/dL     Total Cholesterol: 239 mg/dL   Health Maintenance  Topic Date Due   HIV Screening  Never done   Hepatitis C Screening  Never done   COVID-19 Vaccine (3 - Pfizer risk series) 11/28/2023*   Pap with HPV screening  11/11/2024*   Flu Shot  12/27/2023   Mammogram  09/18/2025   DTaP/Tdap/Td vaccine (3 - Td or Tdap) 07/10/2026   Colon Cancer Screening  03/16/2031   Zoster (Shingles) Vaccine  Completed   HPV Vaccine  Aged Out   Meningitis B Vaccine  Aged Out  *Topic was postponed. The date shown is not the  original due date.    Most recent fall risk assessment:     No data to display           Most recent depression screenings:    09/11/2023   12:12 PM  PHQ 2/9 Scores  PHQ - 2 Score 0  PHQ- 9 Score 3      The 10-year ASCVD risk score (Arnett DK, et al., 2019) is: 1.9%   Values used to calculate the score:     Age: 20 years     Clincally relevant sex: Female     Is Non-Hispanic African American: No     Diabetic: No     Tobacco smoker: No     Systolic Blood Pressure: 124 mmHg     Is BP treated: No     HDL Cholesterol: 52 mg/dL     Total Cholesterol: 241 mg/dL  Past Surgical History:  Procedure Laterality Date   ABDOMINAL HYSTERECTOMY     APPENDECTOMY     BREAST BIOPSY Bilateral 03/2011   BREAST LUMPECTOMY Left 09/24/2017   CESAREAN SECTION     CHOLECYSTECTOMY  2005   DISTAL CLAVICLE EXCISION Left 01/12/2022   Distal clavicle resection arthroscopic   LASER ABLATION     SHOULDER ARTHROSCOPY W/ ROTATOR CUFF REPAIR Left 01/12/2022   SHOULDER ARTHROSCOPY,SUBACROMIAL DECOMPRESSION, DISTAL CLAVICLE EXCISION Left 01/12/2022   TONSILLECTOMY     Social History   Tobacco Use   Smoking status: Never   Smokeless tobacco: Never  Vaping Use   Vaping status: Never Used  Substance Use Topics   Alcohol use: No   Drug use: No   Social History   Socioeconomic History   Marital status: Married    Spouse name: Not on file   Number of children: Not on file   Years of education: Not on file   Highest education level: Not on file  Occupational History   Not on file  Tobacco Use   Smoking status: Never   Smokeless tobacco: Never  Vaping Use   Vaping status: Never Used  Substance and Sexual Activity   Alcohol use: No   Drug use: No   Sexual activity: Not on file  Other Topics Concern   Not on file  Social History Narrative   Not on file   Social Drivers of Health   Financial Resource Strain: Not on file  Food Insecurity: Low Risk  (10/16/2022)   Received from  [facility]   Hunger Vital Sign    Within the past 12 months, you worried that your food would run out before you got money to buy more: Never true    Within the  past 12 months, the food you bought just didn't last and you didn't have money to get more. : Never true  Transportation Needs: Not on file (10/16/2022)  Physical Activity: Not on file  Stress: Not on file  Social Connections: Not on file  Intimate Partner Violence: Not on file   Family History  Problem Relation Age of Onset   Healthy Mother    Hypertension Father    Hyperlipidemia Father    Depression Sister    Hypothyroidism Sister    Breast cancer Maternal Grandmother    Seizures Daughter    Migraines Daughter    Crohn's disease Son    Ovarian cancer Neg Hx    Uterine cancer Neg Hx    Colon cancer Neg Hx    Heart disease Neg Hx    No Known Allergies   Patient Care Team: Amadeo June, MD as PCP - General (Family Medicine)   Outpatient Medications Prior to Visit  Medication Sig   fluticasone (FLONASE) 50 MCG/ACT nasal spray Place 1 spray into both nostrils daily.   ibuprofen  (ADVIL ,MOTRIN ) 800 MG tablet Take 1 tablet (800 mg total) by mouth every 8 (eight) hours as needed.   LEXAPRO 10 MG tablet Take 10 mg by mouth daily.   Multiple Vitamins-Minerals (MULTIVITAMIN WITH MINERALS) tablet Take 1 tablet by mouth daily.   Probiotic Product (PROBIOTIC ADVANCED PO) Take by mouth.   [DISCONTINUED] amitriptyline  (ELAVIL ) 10 MG tablet Take 1 tablet (10 mg total) by mouth at bedtime. Take one hour prior to bedtime   [DISCONTINUED] rizatriptan  (MAXALT ) 10 MG tablet Take 1 tablet (10 mg total) by mouth as needed for migraine. May repeat in 2 hours if needed   No facility-administered medications prior to visit.    ROS     Objective:    BP 124/80   Pulse 70   Temp 98.1 F (36.7 C)   Ht 5' 3 (1.6 m)   Wt 208 lb 2 oz (94.4 kg)   SpO2 99%   BMI 36.87 kg/m  BP Readings from Last 3 Encounters:  11/12/23 124/80   09/11/23 122/80  07/31/22 116/76   Wt Readings from Last 3 Encounters:  11/12/23 208 lb 2 oz (94.4 kg)  09/11/23 206 lb 8 oz (93.7 kg)  07/31/22 201 lb 9.6 oz (91.4 kg)    Physical Exam Vitals and nursing note reviewed.  Constitutional:      Appearance: Normal appearance.  HENT:     Head: Normocephalic.     Right Ear: Tympanic membrane, ear canal and external ear normal.     Left Ear: Tympanic membrane, ear canal and external ear normal.   Eyes:     Extraocular Movements: Extraocular movements intact.     Conjunctiva/sclera: Conjunctivae normal.     Pupils: Pupils are equal, round, and reactive to light.    Cardiovascular:     Rate and Rhythm: Normal rate and regular rhythm.     Heart sounds: Normal heart sounds.  Pulmonary:     Effort: Pulmonary effort is normal.     Breath sounds: Normal breath sounds. No wheezing.  Abdominal:     Tenderness: There is no abdominal tenderness. There is no guarding.   Musculoskeletal:     Right lower leg: No edema.     Left lower leg: No edema.   Neurological:     General: No focal deficit present.     Mental Status: She is alert and oriented to person, place, and time. Mental status is  at baseline.     Cranial Nerves: Cranial nerves 2-12 are intact.     Motor: Motor function is intact.     Coordination: Coordination is intact. Romberg sign negative. Coordination normal. Finger-Nose-Finger Test normal.     Gait: Gait is intact. Gait and tandem walk normal.     Deep Tendon Reflexes: Reflexes are normal and symmetric.   Psychiatric:        Mood and Affect: Mood normal.        Behavior: Behavior normal.        Thought Content: Thought content normal.        Judgment: Judgment normal.      No results found for any visits on 11/12/23.      Amadeo June, MD

## 2023-11-13 ENCOUNTER — Ambulatory Visit: Payer: Self-pay | Admitting: Family Medicine

## 2023-11-13 LAB — LIPID PANEL
Cholesterol: 239 mg/dL — ABNORMAL HIGH (ref <200)
HDL: 48 mg/dL — ABNORMAL LOW (ref >=50)
LDL Cholesterol (Calc): 161 mg/dL — ABNORMAL HIGH
Non-HDL Cholesterol (Calc): 191 mg/dL — ABNORMAL HIGH (ref <130)
Total CHOL/HDL Ratio: 5 (calc) — ABNORMAL HIGH (ref <5.0)
Triglycerides: 158 mg/dL — ABNORMAL HIGH (ref <150)

## 2023-11-13 LAB — CBC
HCT: 41.5 % (ref 35.0–45.0)
Hemoglobin: 13.4 g/dL (ref 11.7–15.5)
MCH: 28.7 pg (ref 27.0–33.0)
MCHC: 32.3 g/dL (ref 32.0–36.0)
MCV: 88.9 fL (ref 80.0–100.0)
MPV: 9.9 fL (ref 7.5–12.5)
Platelets: 337 10*3/uL (ref 140–400)
RBC: 4.67 10*6/uL (ref 3.80–5.10)
RDW: 13 % (ref 11.0–15.0)
WBC: 8.8 10*3/uL (ref 3.8–10.8)

## 2023-11-13 LAB — COMPREHENSIVE METABOLIC PANEL WITH GFR
AG Ratio: 1.4 (calc) (ref 1.0–2.5)
ALT: 24 U/L (ref 6–29)
AST: 17 U/L (ref 10–35)
Albumin: 4.3 g/dL (ref 3.6–5.1)
Alkaline phosphatase (APISO): 84 U/L (ref 37–153)
BUN: 12 mg/dL (ref 7–25)
CO2: 26 mmol/L (ref 20–32)
Calcium: 9.5 mg/dL (ref 8.6–10.4)
Chloride: 103 mmol/L (ref 98–110)
Creat: 0.85 mg/dL (ref 0.50–1.03)
Globulin: 3 g/dL (ref 1.9–3.7)
Glucose, Bld: 88 mg/dL (ref 65–99)
Potassium: 4.7 mmol/L (ref 3.5–5.3)
Sodium: 138 mmol/L (ref 135–146)
Total Bilirubin: 0.4 mg/dL (ref 0.2–1.2)
Total Protein: 7.3 g/dL (ref 6.1–8.1)
eGFR: 82 mL/min/{1.73_m2} (ref >=60)

## 2023-11-13 LAB — TSH: TSH: 1.84 m[IU]/L

## 2024-02-05 ENCOUNTER — Telehealth (INDEPENDENT_AMBULATORY_CARE_PROVIDER_SITE_OTHER): Payer: Self-pay

## 2024-02-05 NOTE — Telephone Encounter (Signed)
Pt called and aware

## 2024-02-06 ENCOUNTER — Ambulatory Visit (INDEPENDENT_AMBULATORY_CARE_PROVIDER_SITE_OTHER): Admitting: Audiology

## 2024-02-06 ENCOUNTER — Encounter (INDEPENDENT_AMBULATORY_CARE_PROVIDER_SITE_OTHER): Payer: Self-pay | Admitting: Otolaryngology

## 2024-02-06 ENCOUNTER — Ambulatory Visit (INDEPENDENT_AMBULATORY_CARE_PROVIDER_SITE_OTHER): Admitting: Otolaryngology

## 2024-02-06 VITALS — BP 124/85 | HR 77

## 2024-02-06 DIAGNOSIS — R0982 Postnasal drip: Secondary | ICD-10-CM

## 2024-02-06 DIAGNOSIS — Z0110 Encounter for examination of ears and hearing without abnormal findings: Secondary | ICD-10-CM | POA: Diagnosis not present

## 2024-02-06 DIAGNOSIS — H905 Unspecified sensorineural hearing loss: Secondary | ICD-10-CM | POA: Diagnosis not present

## 2024-02-06 DIAGNOSIS — J343 Hypertrophy of nasal turbinates: Secondary | ICD-10-CM

## 2024-02-06 DIAGNOSIS — H93293 Other abnormal auditory perceptions, bilateral: Secondary | ICD-10-CM

## 2024-02-06 DIAGNOSIS — R0981 Nasal congestion: Secondary | ICD-10-CM | POA: Diagnosis not present

## 2024-02-06 DIAGNOSIS — J310 Chronic rhinitis: Secondary | ICD-10-CM

## 2024-02-06 DIAGNOSIS — H903 Sensorineural hearing loss, bilateral: Secondary | ICD-10-CM

## 2024-02-06 MED ORDER — IPRATROPIUM BROMIDE 0.06 % NA SOLN: 2.0000 | Freq: Two times a day (BID) | NASAL | 12 refills | Status: AC | PRN

## 2024-02-06 MED ORDER — FLUTICASONE PROPIONATE 50 MCG/ACT NA SUSP: 2.0000 | Freq: Every day | NASAL | 10 refills | Status: AC

## 2024-02-06 NOTE — Progress Notes (Signed)
  [address] [place], [place] [address] [phone]  Audiological Evaluation    Name: Katrina Elliott     DOB:   [birth year]      MRN:   [id number]                                                                                     Service Date: 02/06/2024     Accompanied by: unaccompanied   Patient comes today after Dr. Karis, ENT sent a referral for a hearing evaluation due to concerns with hearing loss.   Symptoms Yes Details  Hearing loss  [x]  Reports difficulty understanding in presence of background noise at work, [employer] or with friends.   Previous testing at [facility] from 04-11-2023 indicate: LEFT ear:Thresholds consistent with a mild low frequency sensorineural hearing loss. Speech testing gave an SRT of 25 dBHL and WRS of 100% presented at 55dBHL, using a NU-6 wordlist.  RIGHT ear: Thresholds consistent with a mild flat sensorineural hearing loss. Speech testing gave an SRT of 30 dBHL and WRS of 100% presented at 60dBHL, using a NU-6 wordlist.   Tinnitus  [x]  Sometimes in the right ear  Ear pain/ infections/pressure  [x]  Sometimes fullness in the right ear  Balance problems  [x]  Reports she has been clumsy within the last few years.  Noise exposure history  []    Previous ear surgeries  []    Family history of hearing loss  []    Amplification  []    Other  []      Otoscopy: Right ear: Clear external ear canal and notable landmarks visualized on the tympanic membrane. Left ear:  Clear external ear canal and notable landmarks visualized on the tympanic membrane.  Tympanometry: Right ear: Type A- Normal external ear canal volume with normal middle ear pressure and tympanic membrane compliance. Left ear: Type A- Normal external ear canal volume with normal middle ear pressure and tympanic membrane compliance.  Distortion Product Otoacoustic Emissions: Equipment not available.  Pure tone Audiometry: Both ears- Normal hearing from [phone]  Hz.  Speech Audiometry: Right ear- Speech Reception Threshold (SRT) was obtained at 15 dBHL. Left ear-Speech Reception Threshold (SRT) was obtained at 10 dBHL.   Word Recognition Score Tested using NU-6 (recorded) Right ear: 100% was obtained at a presentation level of 10 dBHL with contralateral masking which is deemed as  excellent. Left ear: 100% was obtained at a presentation level of 5 dBHL with contralateral masking which is deemed as  excellent.  QuickSIN was completed at 70dBHL and the patient scored 4.5dB which indicates a mild signa-to-noise ratio loss.  The hearing test results were completed under headphones and results are deemed to be of good reliability. Test technique:  conventional    Recommendations: Follow up with ENT as scheduled for today. Return for a hearing evaluation if concerns with hearing changes arise or per MD recommendation. Recommend using an over the counter hearing aid to see if it helps reduce the listening effort she experiences.   Jolaine Fryberger MARIE LEROUX-MARTINEZ, AUD

## 2024-02-08 DIAGNOSIS — H903 Sensorineural hearing loss, bilateral: Secondary | ICD-10-CM | POA: Insufficient documentation

## 2024-02-08 DIAGNOSIS — J343 Hypertrophy of nasal turbinates: Secondary | ICD-10-CM | POA: Insufficient documentation

## 2024-02-08 DIAGNOSIS — J310 Chronic rhinitis: Secondary | ICD-10-CM | POA: Insufficient documentation

## 2024-02-08 DIAGNOSIS — R0982 Postnasal drip: Secondary | ICD-10-CM | POA: Insufficient documentation

## 2024-02-08 NOTE — Progress Notes (Signed)
 CC: Chronic nasal congestion, nasal drainage, hearing loss  HPI:  Katrina Elliott is a 53 y.o. female who presents today with multiple medical complaints, including chronic nasal congestion, nasal drainage, and hearing loss.  According to the patient, she has been symptomatic for more than 2 years.  Her frequent nasal drainage has caused frequent coughing spells.  She denies any known environmental allergies.  She also denies any recent sinusitis.  She uses OTC allergy medications and Flonase  as needed.  She also complains of hearing difficulty, especially in noisy environments.  She denies any otalgia, otorrhea, or vertigo.  Past Medical History:  Diagnosis Date   GERD (gastroesophageal reflux disease)    Hyperlipidemia    Kidney stone 2003   Migraine    Migraines     Past Surgical History:  Procedure Laterality Date   ABDOMINAL HYSTERECTOMY     APPENDECTOMY     BREAST BIOPSY Bilateral 03/2011   BREAST LUMPECTOMY Left 09/24/2017   CESAREAN SECTION     CHOLECYSTECTOMY  2005   DISTAL CLAVICLE EXCISION Left 01/12/2022   Distal clavicle resection arthroscopic   LASER ABLATION     SHOULDER ARTHROSCOPY Elliott/ ROTATOR CUFF REPAIR Left 01/12/2022   SHOULDER ARTHROSCOPY,SUBACROMIAL DECOMPRESSION, DISTAL CLAVICLE EXCISION Left 01/12/2022   TONSILLECTOMY      Family History  Problem Relation Age of Onset   Healthy Mother    Hypertension Father    Hyperlipidemia Father    Depression Sister    Hypothyroidism Sister    Breast cancer Maternal Grandmother    Seizures Daughter    Migraines Daughter    Crohn's disease Son    Ovarian cancer Neg Hx    Uterine cancer Neg Hx    Colon cancer Neg Hx    Heart disease Neg Hx     Social History:  reports that she has never smoked. She has never used smokeless tobacco. She reports that she does not drink alcohol and does not use drugs.  Allergies: No Known Allergies  Prior to Admission medications   Medication Sig Start Date End Date Taking?  Authorizing Provider  amitriptyline  (ELAVIL ) 25 MG tablet Take 1 tablet (25 mg total) by mouth at bedtime. 11/12/23  Yes Aletha Bene, MD  fluticasone  (FLONASE ) 50 MCG/ACT nasal spray Place 1 spray into both nostrils daily. 06/10/23 06/09/24 Yes [provider]  fluticasone  (FLONASE ) 50 MCG/ACT nasal spray Place 2 sprays into both nostrils daily. 02/06/24 03/07/24 Yes Karis Clunes, MD  ibuprofen  (ADVIL ,MOTRIN ) 800 MG tablet Take 1 tablet (800 mg total) by mouth every 8 (eight) hours as needed. 10/14/16  Yes Long, Joshua G, MD  ipratropium (ATROVENT ) 0.06 % nasal spray Place 2 sprays into both nostrils 2 (two) times daily as needed (nasal drainage). 02/06/24 03/07/24 Yes Karis Clunes, MD  LEXAPRO 10 MG tablet Take 10 mg by mouth daily. 08/27/22  Yes [provider]  Multiple Vitamins-Minerals (MULTIVITAMIN WITH MINERALS) tablet Take 1 tablet by mouth daily.   Yes [provider]  Probiotic Product (PROBIOTIC ADVANCED PO) Take by mouth.   Yes [provider]  rizatriptan  (MAXALT ) 10 MG tablet Take 1 tablet (10 mg total) by mouth as needed for migraine. May repeat in 2 hours if needed 11/12/23  Yes Aletha Bene, MD    Blood pressure 124/85, pulse 77, SpO2 98%. Exam: General: Communicates without difficulty, well nourished, no acute distress. Head: Normocephalic, no evidence injury, no tenderness, facial buttresses intact without stepoff. Face/sinus: No tenderness to palpation and percussion. Facial movement is  normal and symmetric. Eyes: PERRL, EOMI. No scleral icterus, conjunctivae clear. Neuro: CN II exam reveals vision grossly intact.  No nystagmus at any point of gaze. Ears: Auricles well formed without lesions.  Ear canals are intact without mass or lesion.  No erythema or edema is appreciated.  The TMs are intact without fluid. Nose: External evaluation reveals normal support and skin without lesions.  Dorsum is intact.  Anterior rhinoscopy reveals congested mucosa over  anterior aspect of inferior turbinates and intact septum.  No purulence noted. Oral:  Oral cavity and oropharynx are intact, symmetric, without erythema or edema.  Mucosa is moist without lesions. Neck: Full range of motion without pain.  There is no significant lymphadenopathy.  No masses palpable.  Thyroid bed within normal limits to palpation.  Parotid glands and submandibular glands equal bilaterally without mass.  Trachea is midline. Neuro:  CN 2-12 grossly intact.   Procedure:  Flexible Nasal Endoscopy: Description: Risks, benefits, and alternatives of flexible endoscopy were explained to the patient.  Specific mention was made of the risk of throat numbness with difficulty swallowing, possible bleeding from the nose and mouth, and pain from the procedure.  The patient gave oral consent to proceed.  The flexible scope was inserted into the right nasal cavity.  Endoscopy of the interior nasal cavity, superior, inferior, and middle meatus was performed. The sphenoid-ethmoid recess was examined. Edematous mucosa was noted.  No polyp, mass, or lesion was appreciated. Olfactory cleft was clear.  Nasopharynx with postnasal drainage.  Turbinates were hypertrophied but without mass.  The procedure was repeated on the contralateral side with similar findings.  The patient tolerated the procedure well.    Her hearing test shows minimal bilateral high-frequency sensorineural hearing loss.  Assessment: 1.  Chronic rhinitis with nasal mucosal congestion and bilateral inferior turbinate hypertrophy. 2.  Chronic postnasal drainage. 3.  Minimal high-frequency sensorineural hearing loss.  Plan: 1.  The physical exam and nasal endoscopy findings are reviewed with the patient. 2.  The hearing test results are also reviewed. 3.  Flonase  nasal spray 2 sprays each nostril daily. 4.  Atrovent  nasal sprays as needed to treat the nasal drainage. 5.  The patient will return for reevaluation in 2 months.  Katrina Elliott  Katrina Elliott 02/08/2024, 1:16 PM

## 2024-02-08 NOTE — Addendum Note (Signed)
 Addended byBETHA MOCCASIN, DANIEL on: 02/08/2024 01:40 PM   Modules accepted: Orders

## 2024-04-10 ENCOUNTER — Ambulatory Visit (INDEPENDENT_AMBULATORY_CARE_PROVIDER_SITE_OTHER): Admitting: Otolaryngology

## 2024-06-26 ENCOUNTER — Other Ambulatory Visit: Payer: Self-pay

## 2024-06-26 ENCOUNTER — Encounter: Payer: Self-pay | Admitting: Family Medicine

## 2024-06-26 MED ORDER — LEXAPRO 10 MG PO TABS: 10.0000 mg | ORAL_TABLET | Freq: Every day | ORAL | 1 refills | Status: AC

## 2024-06-26 MED ORDER — LEXAPRO 10 MG PO TABS: 10.0000 mg | ORAL_TABLET | Freq: Every day | ORAL | 1 refills | Status: DC

## 2024-06-29 ENCOUNTER — Other Ambulatory Visit (HOSPITAL_COMMUNITY): Payer: Self-pay

## 2024-11-13 ENCOUNTER — Encounter: Admitting: Family Medicine
